# Patient Record
Sex: Female | Born: 1955 | Race: White | Hispanic: No | Marital: Married | Smoking: Never smoker
Health system: Southern US, Community
[De-identification: ages and names within clinical notes are randomized; demographics above are authoritative.]

## PROBLEM LIST (undated history)

## (undated) DIAGNOSIS — G459 Transient cerebral ischemic attack, unspecified: Secondary | ICD-10-CM

## (undated) DIAGNOSIS — D751 Secondary polycythemia: Secondary | ICD-10-CM

## (undated) DIAGNOSIS — G43909 Migraine, unspecified, not intractable, without status migrainosus: Secondary | ICD-10-CM

## (undated) DIAGNOSIS — I10 Essential (primary) hypertension: Secondary | ICD-10-CM

## (undated) HISTORY — PX: TONSILLECTOMY AND ADENOIDECTOMY: SUR1326

## (undated) HISTORY — DX: Secondary polycythemia: D75.1

## (undated) HISTORY — DX: Transient cerebral ischemic attack, unspecified: G45.9

## (undated) HISTORY — DX: Essential (primary) hypertension: I10

## (undated) HISTORY — DX: Migraine, unspecified, not intractable, without status migrainosus: G43.909

---

## 1998-04-07 ENCOUNTER — Other Ambulatory Visit: Admission: RE | Admit: 1998-04-07 | Discharge: 1998-04-07 | Payer: Self-pay | Admitting: *Deleted

## 2005-05-23 ENCOUNTER — Ambulatory Visit: Payer: Self-pay | Admitting: Family Medicine

## 2005-06-03 ENCOUNTER — Ambulatory Visit: Payer: Self-pay | Admitting: Family Medicine

## 2006-05-23 DIAGNOSIS — Z0000 Encounter for general adult medical examination without abnormal findings: Secondary | ICD-10-CM | POA: Insufficient documentation

## 2006-06-14 ENCOUNTER — Ambulatory Visit: Payer: Self-pay | Admitting: Family Medicine

## 2006-06-20 ENCOUNTER — Ambulatory Visit: Payer: Self-pay | Admitting: Gastroenterology

## 2007-05-25 DIAGNOSIS — G43109 Migraine with aura, not intractable, without status migrainosus: Secondary | ICD-10-CM | POA: Insufficient documentation

## 2007-06-19 ENCOUNTER — Ambulatory Visit: Payer: Self-pay | Admitting: Family Medicine

## 2008-05-26 DIAGNOSIS — R319 Hematuria, unspecified: Secondary | ICD-10-CM | POA: Insufficient documentation

## 2008-06-19 ENCOUNTER — Ambulatory Visit: Payer: Self-pay | Admitting: Family Medicine

## 2009-06-22 ENCOUNTER — Ambulatory Visit: Payer: Self-pay | Admitting: Family Medicine

## 2010-06-23 ENCOUNTER — Ambulatory Visit: Payer: Self-pay | Admitting: Family Medicine

## 2011-06-29 ENCOUNTER — Ambulatory Visit: Payer: Self-pay | Admitting: Family Medicine

## 2012-07-03 ENCOUNTER — Ambulatory Visit: Payer: Self-pay | Admitting: Family Medicine

## 2013-07-04 ENCOUNTER — Ambulatory Visit: Payer: Self-pay | Admitting: Family Medicine

## 2013-08-29 DIAGNOSIS — G459 Transient cerebral ischemic attack, unspecified: Secondary | ICD-10-CM

## 2013-08-29 HISTORY — DX: Transient cerebral ischemic attack, unspecified: G45.9

## 2014-04-16 ENCOUNTER — Ambulatory Visit: Payer: Self-pay

## 2014-05-27 ENCOUNTER — Ambulatory Visit: Payer: Self-pay | Admitting: Internal Medicine

## 2014-08-13 ENCOUNTER — Ambulatory Visit: Payer: Self-pay

## 2015-08-14 ENCOUNTER — Other Ambulatory Visit: Payer: Self-pay | Admitting: Nurse Practitioner

## 2015-08-14 DIAGNOSIS — Z1231 Encounter for screening mammogram for malignant neoplasm of breast: Secondary | ICD-10-CM

## 2015-08-21 ENCOUNTER — Ambulatory Visit
Admission: RE | Admit: 2015-08-21 | Discharge: 2015-08-21 | Disposition: A | Discharge status: Home / Self Care | Payer: 59 | Source: Ambulatory Visit | Attending: Nurse Practitioner | Admitting: Nurse Practitioner

## 2015-08-21 DIAGNOSIS — Z1231 Encounter for screening mammogram for malignant neoplasm of breast: Secondary | ICD-10-CM | POA: Diagnosis not present

## 2015-08-21 IMAGING — MG MM DIGITAL SCREENING BILAT W/ CAD
4 series · 4 of 4 positions shown · non-contrast
Comparison: Previous exam(s).

CLINICAL DATA: Screening.

EXAM:
DIGITAL SCREENING BILATERAL MAMMOGRAM WITH CAD

[L CC]
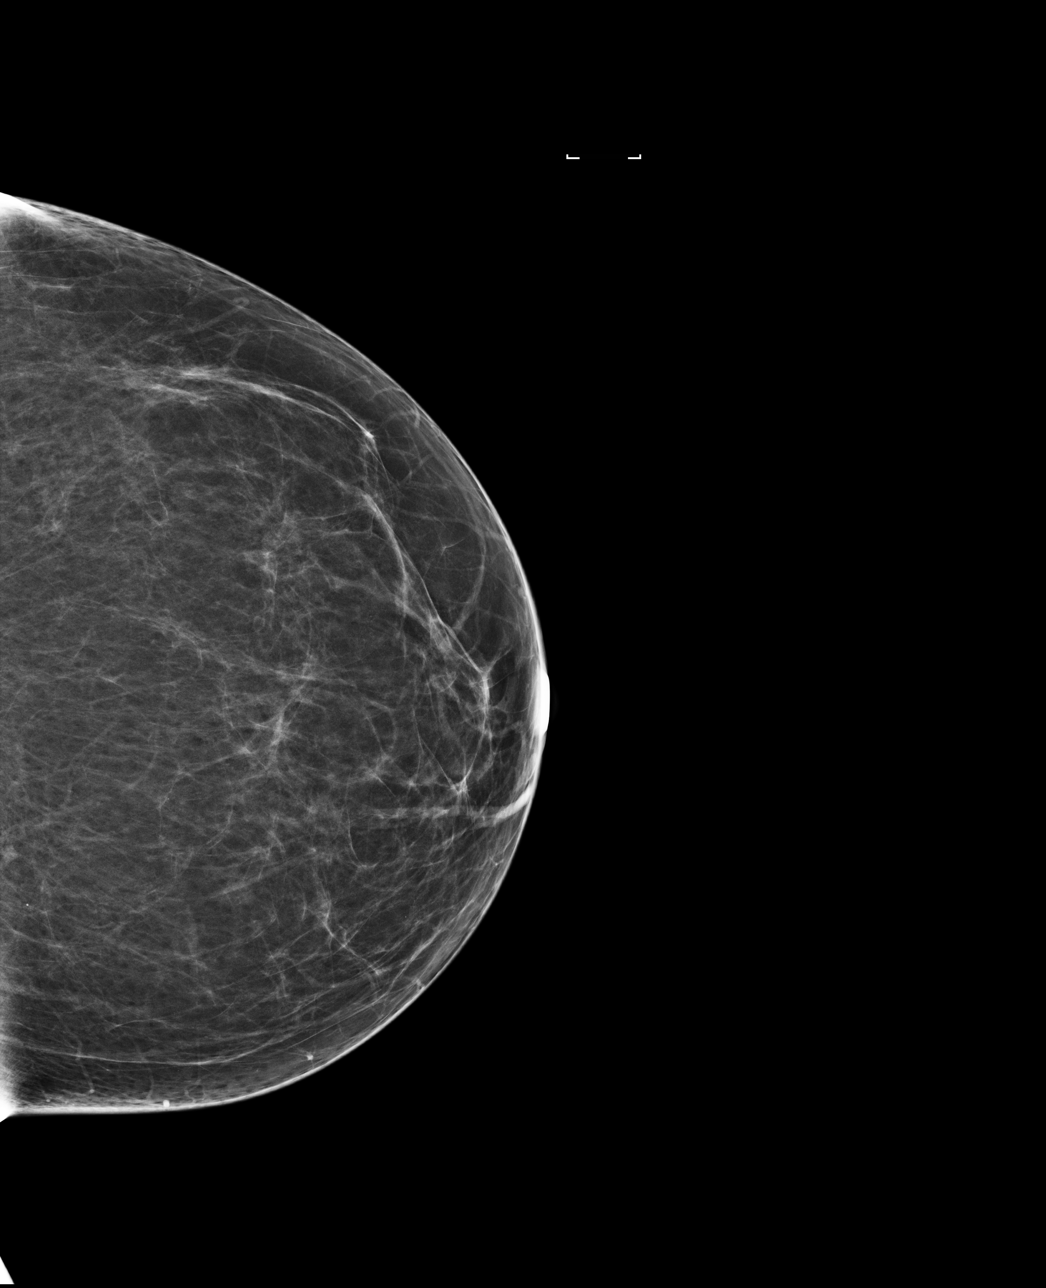

[R CC]
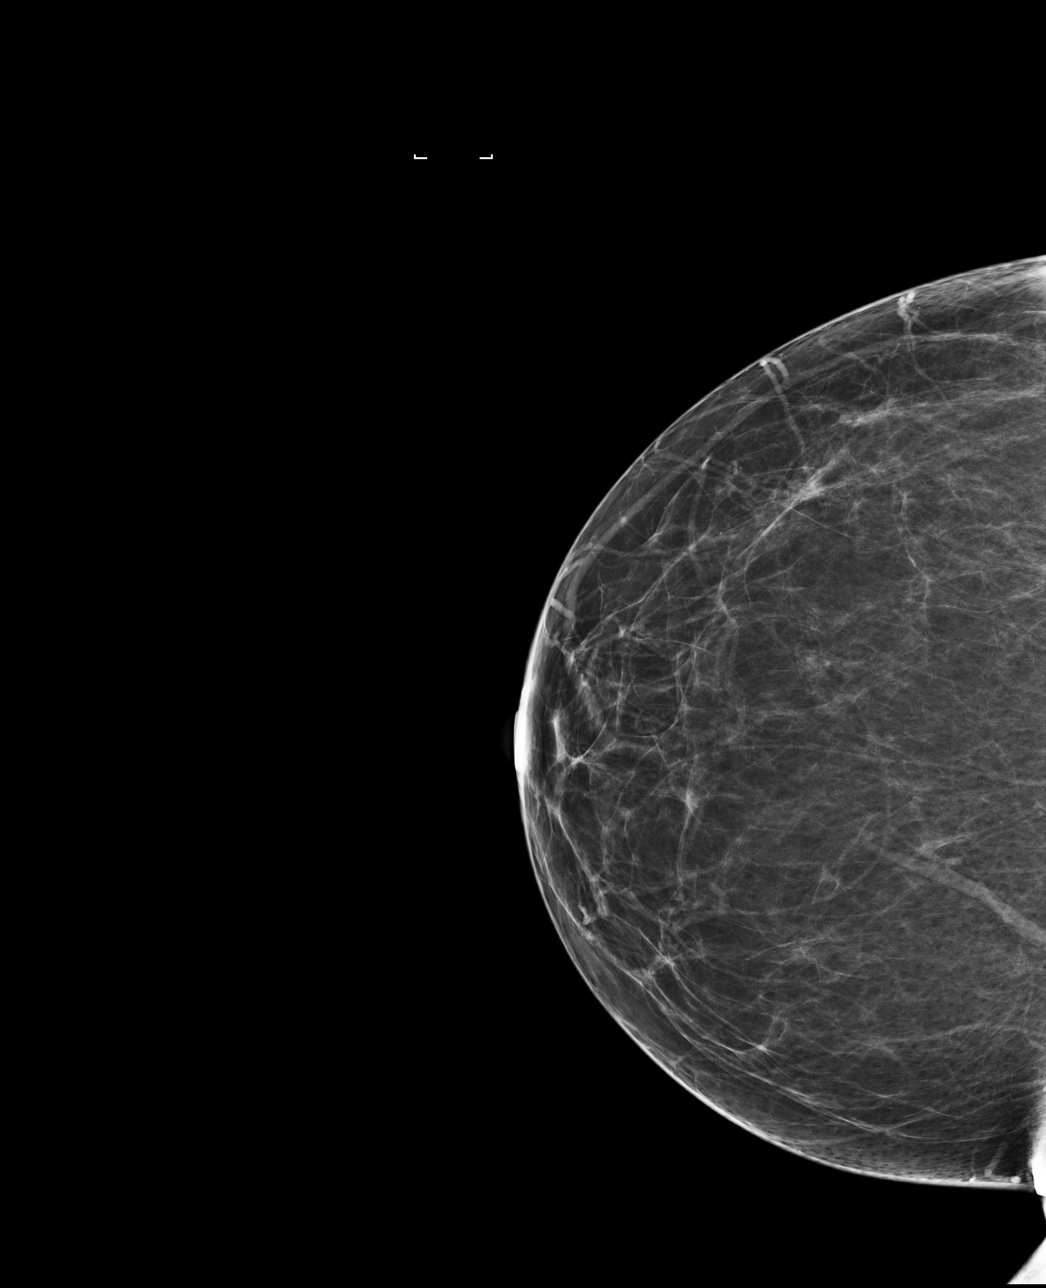

[L MLO]
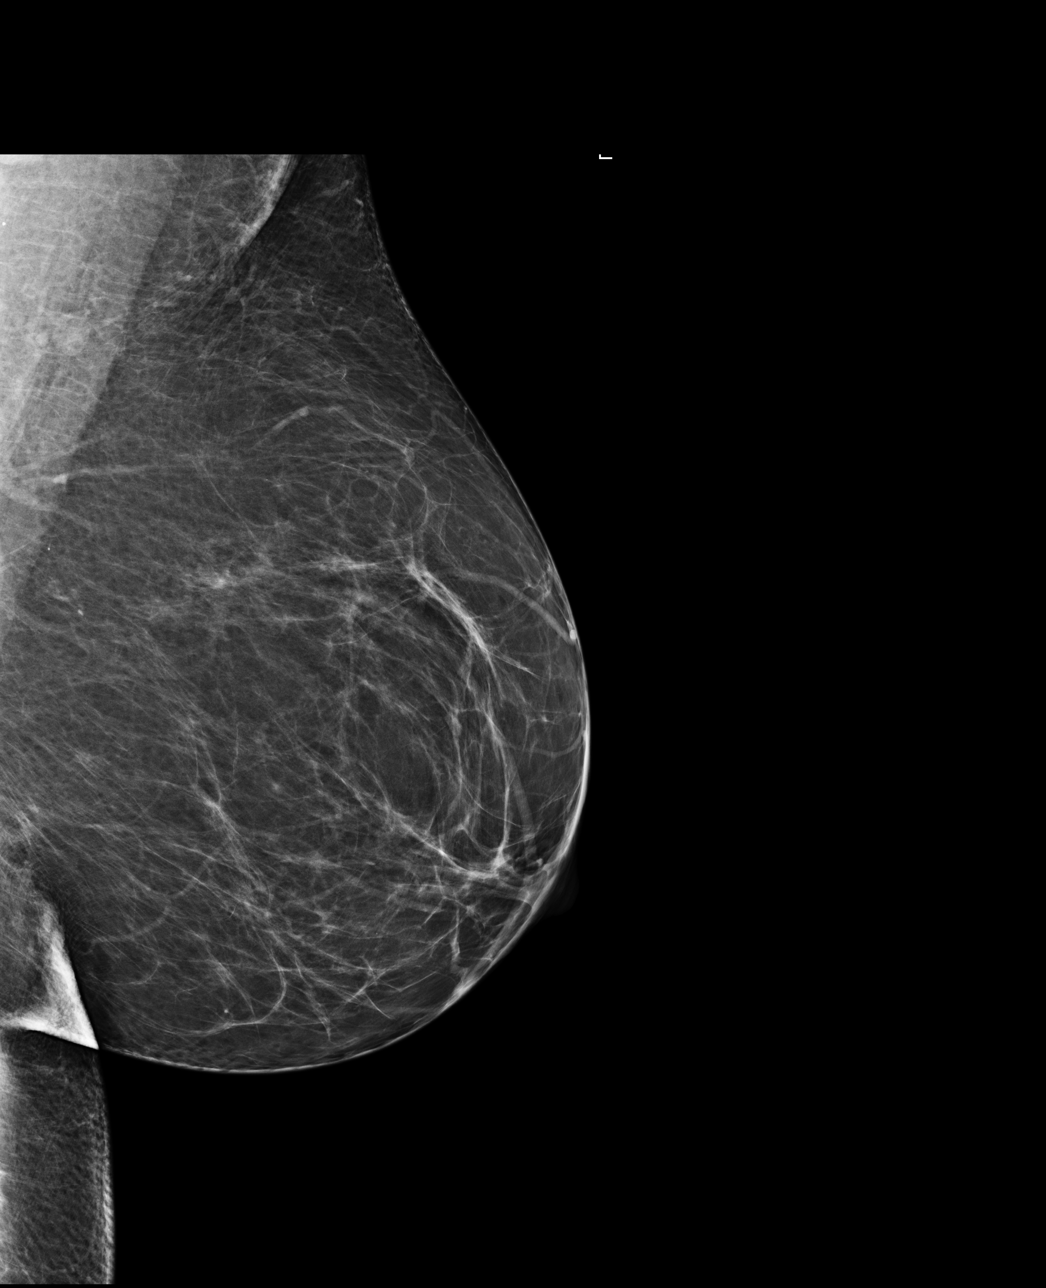

[R MLO]
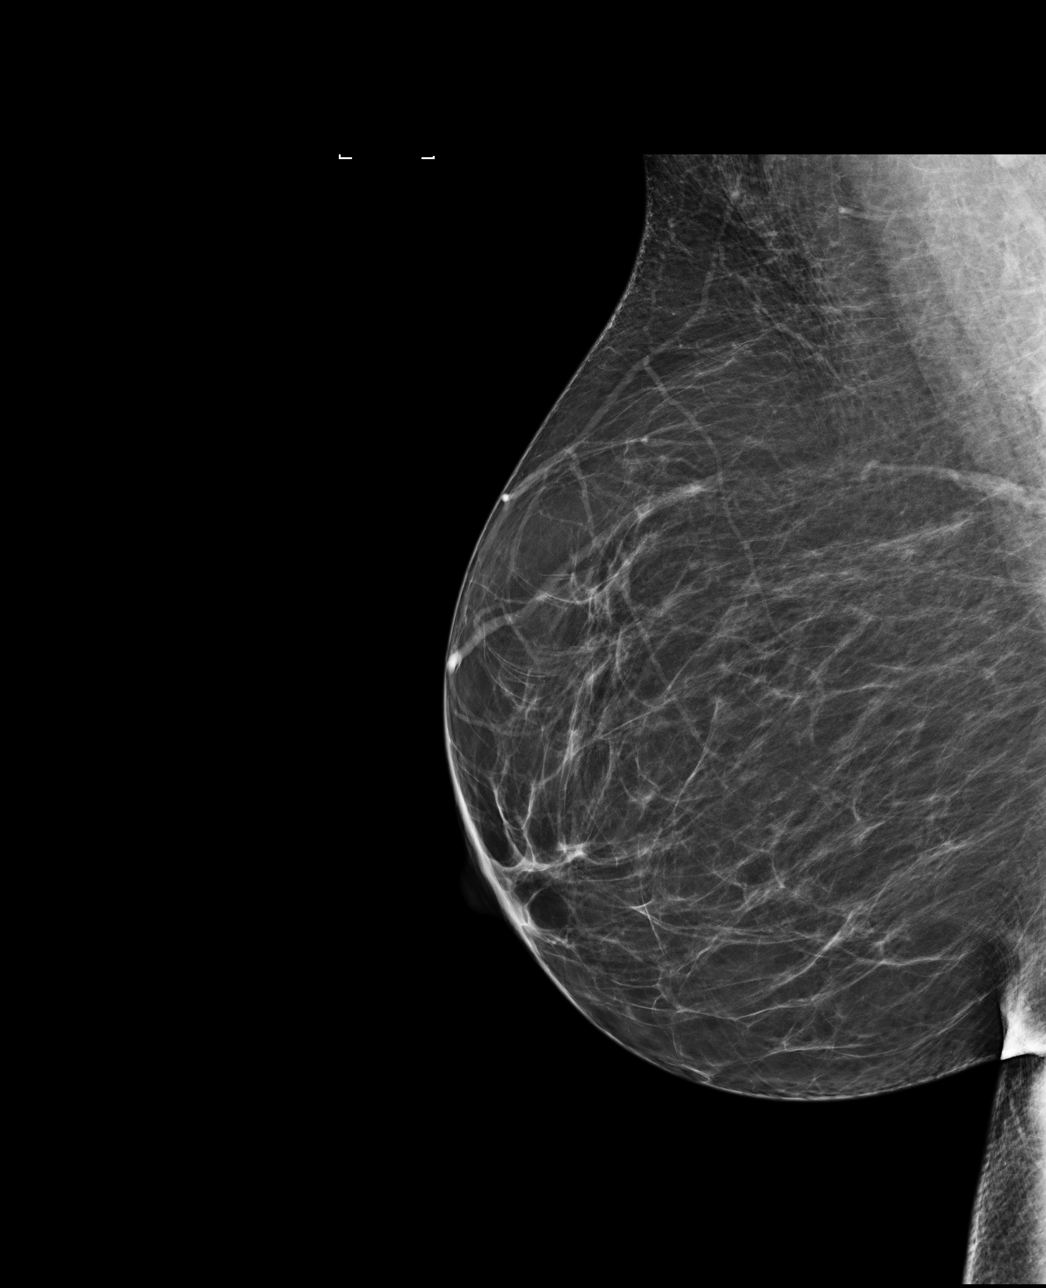

[4 of 4 positions shown; findings below may reference images not displayed]

ACR Breast Density Category b: There are scattered areas of
fibroglandular density.
FINDINGS: There are no findings suspicious for malignancy. Images were
processed with CAD.
IMPRESSION: No mammographic evidence of malignancy. A result letter of this
screening mammogram will be mailed directly to the patient.

RECOMMENDATION:
Screening mammogram in one year. (Code:[US])

BI-RADS CATEGORY  1: Negative.

## 2015-09-25 ENCOUNTER — Inpatient Hospital Stay: Payer: 59

## 2015-09-25 ENCOUNTER — Inpatient Hospital Stay: Payer: 59 | Attending: Internal Medicine | Admitting: Internal Medicine

## 2015-09-25 ENCOUNTER — Encounter: Payer: Self-pay | Admitting: Internal Medicine

## 2015-09-25 VITALS — BP 145/89 | HR 75 | Temp 98.2°F | Resp 18 | Ht 64.0 in | Wt 192.0 lb

## 2015-09-25 DIAGNOSIS — D751 Secondary polycythemia: Secondary | ICD-10-CM

## 2015-09-25 DIAGNOSIS — Z575 Occupational exposure to toxic agents in other industries: Secondary | ICD-10-CM | POA: Diagnosis not present

## 2015-09-25 DIAGNOSIS — I10 Essential (primary) hypertension: Secondary | ICD-10-CM | POA: Diagnosis not present

## 2015-09-25 DIAGNOSIS — Z7902 Long term (current) use of antithrombotics/antiplatelets: Secondary | ICD-10-CM

## 2015-09-25 DIAGNOSIS — Z79899 Other long term (current) drug therapy: Secondary | ICD-10-CM | POA: Diagnosis not present

## 2015-09-25 DIAGNOSIS — R0683 Snoring: Secondary | ICD-10-CM | POA: Diagnosis not present

## 2015-09-25 DIAGNOSIS — Z8673 Personal history of transient ischemic attack (TIA), and cerebral infarction without residual deficits: Secondary | ICD-10-CM

## 2015-09-25 LAB — CBC WITH DIFFERENTIAL/PLATELET
Basophils Absolute: 0 10*3/uL (ref 0–0.1)
Basophils Relative: 1 %
Eosinophils Absolute: 0.1 10*3/uL (ref 0–0.7)
Eosinophils Relative: 1 %
HCT: 49.6 % — ABNORMAL HIGH (ref 35.0–47.0)
Hemoglobin: 16.7 g/dL — ABNORMAL HIGH (ref 12.0–16.0)
Lymphocytes Relative: 25 %
Lymphs Abs: 1.9 10*3/uL (ref 1.0–3.6)
MCH: 29.3 pg (ref 26.0–34.0)
MCHC: 33.6 g/dL (ref 32.0–36.0)
MCV: 87 fL (ref 80.0–100.0)
Monocytes Absolute: 0.6 10*3/uL (ref 0.2–0.9)
Monocytes Relative: 8 %
Neutro Abs: 5 10*3/uL (ref 1.4–6.5)
Neutrophils Relative %: 65 %
Platelets: 234 10*3/uL (ref 150–440)
RBC: 5.71 MIL/uL — ABNORMAL HIGH (ref 3.80–5.20)
RDW: 14.1 % (ref 11.5–14.5)
WBC: 7.6 10*3/uL (ref 3.6–11.0)

## 2015-09-25 NOTE — Progress Notes (Signed)
Pt does not have migraines hardly at all anymore she states. She has no resp. Problems, non smoker and drinks a wine or wine cooler once a month.  She states migraines are from where she was on bc pills and sometimes food related.

## 2015-09-25 NOTE — Progress Notes (Signed)
Cancer Center @ [facility] Telephone:(336) [phone]  Fax:(336) [phone]     Valerie Thornton OB: 1956-06-24  MR#: [id number]  AOZ#:[id number]  No care team member to display  CHIEF COMPLAINT:  Chief Complaint  Patient presents with  . erythrocytosis     No history exists.    No flowsheet data found.  HISTORY OF PRESENT ILLNESS:   Valerie Thornton is a 60 year old Caucasian female, who is referred to our clinic for evaluation of erythrocytosis. She is not aware of her baseline hemoglobin, but she developed TIA in the fall of 2015, while taking oral contraceptive pills. At that time she was found to have mildly elevated hemoglobin of 16.2 g/dL. She discontinued OCPs at that point, and her hemoglobin was followed with repeated measurements, which showed further increase to 16.5 g/dL in the fall of 9629. 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 She denies any aquagenic pruritus, redness and swelling of palms and soles, chest pain, shortness of breath, nausea, vomiting, diarrhea, weight loss, cough. She does not leave in or travel to areas of high elevation. She had colonoscopy at the age of 62 (reportedly normal), and mammogram in December 2016 (reportedly normal)  REVIEW OF SYSTEMS:   Review of Systems  All other systems reviewed and are negative.    PAST MEDICAL HISTORY: Past Medical History  Diagnosis Date  . Erythrocytosis   . TIA (transient ischemic attack) 2015    affected right side but completely resolved  . Hypertension   . Migraines     per pt it was hormonal and food caused, hardly anymore she has one    PAST SURGICAL HISTORY: Past Surgical History  Procedure Laterality Date  . Tonsillectomy and adenoidectomy      pt was 60 years old    FAMILY HISTORY Family History  Problem Relation Age of Onset  . Breast cancer Maternal Aunt 41    ADVANCED DIRECTIVES:  No flowsheet data  found.  HEALTH MAINTENANCE: Social History  Substance Use Topics  . Smoking status: Never Smoker   . Smokeless tobacco: Never Used  . Alcohol Use: Yes     Comment: glass once a month     Allergies  Allergen Reactions  . Sulfa Antibiotics Hives    Current Outpatient Prescriptions  Medication Sig Dispense Refill  . amLODipine (NORVASC) 5 MG tablet Take 5 mg by mouth daily.    . benazepril (LOTENSIN) 5 MG tablet Take 5 mg by mouth daily.    . clopidogrel (PLAVIX) 75 MG tablet Take 75 mg by mouth daily.    Marland Kitchen lovastatin (MEVACOR) 40 MG tablet Take 40 mg by mouth at bedtime.    . lovastatin (MEVACOR) 40 MG tablet Take by mouth.     No current facility-administered medications for this visit.    OBJECTIVE:  Filed Vitals:   09/25/15 0952  BP: 145/89  Pulse: 75  Temp: 98.2 F (36.8 C)  Resp: 18     Body mass index is 32.94 kg/(m^2).    ECOG FS:0 - Asymptomatic  Physical Exam  Constitutional: She is oriented to person, place, and time and well-developed, well-nourished, and in no distress. No distress.  Younger than stated age Caucasian female  HENT:  Head: Normocephalic and atraumatic.  Right Ear: External ear normal.  Left Ear: External ear normal.  Mouth/Throat: Oropharynx is clear and moist.  Eyes: Conjunctivae are normal. Pupils are equal, round, and  reactive to light. Right eye exhibits no discharge. Left eye exhibits no discharge. No scleral icterus.  Neck: Normal range of motion. Neck supple. No JVD present. No tracheal deviation present. No thyromegaly present.  Cardiovascular: Normal rate, regular rhythm, normal heart sounds and intact distal pulses.  Exam reveals no gallop and no friction rub.   No murmur heard. Pulmonary/Chest: Effort normal and breath sounds normal. No stridor. No respiratory distress. She has no wheezes. She has no rales. She exhibits no tenderness.  Abdominal: Soft. Bowel sounds are normal. She exhibits no distension and no mass. There is no  tenderness. There is no rebound and no guarding.  Genitourinary:  Postponed  Musculoskeletal: Normal range of motion. She exhibits no edema or tenderness.  Lymphadenopathy:    She has no cervical adenopathy.  Neurological: She is alert and oriented to person, place, and time. She has normal reflexes. No cranial nerve deficit. She exhibits normal muscle tone. Gait normal. Coordination normal. GCS score is 15.  Mild facial asymmetry, but no focal abnormality  Skin: Skin is warm. No rash noted. She is not diaphoretic. No erythema. No pallor.  Psychiatric: Mood, memory, affect and judgment normal.  Nursing note and vitals reviewed.    LAB RESULTS:  No flowsheet data found.  Appointment on 09/25/2015  Component Date Value Ref Range Status  . WBC 09/25/2015 7.6  3.6 - 11.0 K/uL Final  . RBC 09/25/2015 5.71* 3.80 - 5.20 MIL/uL Final  . Hemoglobin 09/25/2015 16.7* 12.0 - 16.0 g/dL Final  . HCT 54/04/8118 49.6* 35.0 - 47.0 % Final  . MCV 09/25/2015 87.0  80.0 - 100.0 fL Final  . MCH 09/25/2015 29.3  26.0 - 34.0 pg Final  . MCHC 09/25/2015 33.6  32.0 - 36.0 g/dL Final  . RDW 14/78/2956 14.1  11.5 - 14.5 % Final  . Platelets 09/25/2015 234  150 - 440 K/uL Final  . Neutrophils Relative % 09/25/2015 65   Final  . Neutro Abs 09/25/2015 5.0  1.4 - 6.5 K/uL Final  . Lymphocytes Relative 09/25/2015 25   Final  . Lymphs Abs 09/25/2015 1.9  1.0 - 3.6 K/uL Final  . Monocytes Relative 09/25/2015 8   Final  . Monocytes Absolute 09/25/2015 0.6  0.2 - 0.9 K/uL Final  . Eosinophils Relative 09/25/2015 1   Final  . Eosinophils Absolute 09/25/2015 0.1  0 - 0.7 K/uL Final  . Basophils Relative 09/25/2015 1   Final  . Basophils Absolute 09/25/2015 0.0  0 - 0.1 K/uL Final      STUDIES: No results found.  ASSESSMENT and MEDICAL DECISION MAKING:  Erythrocytosis-discussed the approach to erythrocytosis, specifically the need to rule out primary erythrocytosis (polycythemia vera) by performing [id number]  and erythropoietin testing. I also explained to the patient that the majority of cases of erythrocytosis are secondary to underlying health problems, such as COPD, emphysema, obstructive sleep apnea, or lifestyle/occupational exposures, which include exposure to cigarette smoking, exhaust fumes or living at high altitude. On rare occasions erythrocytosis could be a sign of erythropoietin-producing tumors. 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 We agreed to proceed with polycythemia vera workup, and if negative, she will consider removing herself from performing this type of family activities for the next 2-3 months. She claims that she snores, but not heavily, so we Yurick consider referring her for sleep study, but after the workup is completed.   Mrs.  Thornton will return to our clinic in 2 weeks to discuss the results of the workup. Patient expressed understanding and was in agreement with this plan. She also understands that She can call clinic any time with any questions, concerns, or complaints.    No matching staging information was found for the patient.  Gorden Harms, MD   09/25/2015 9:51 AM

## 2015-10-05 LAB — JAK2  V617F QUAL. WITH REFLEX TO EXON 12

## 2015-10-05 LAB — JAK2 EXON 12 MUTATION ANALYSIS

## 2015-10-05 LAB — ERYTHROPOIETIN: Erythropoietin: 14.1 m[IU]/mL (ref 2.6–18.5)

## 2015-10-09 ENCOUNTER — Inpatient Hospital Stay: Payer: 59 | Attending: Internal Medicine | Admitting: Internal Medicine

## 2015-10-09 DIAGNOSIS — I10 Essential (primary) hypertension: Secondary | ICD-10-CM | POA: Diagnosis not present

## 2015-10-09 DIAGNOSIS — Z8673 Personal history of transient ischemic attack (TIA), and cerebral infarction without residual deficits: Secondary | ICD-10-CM | POA: Diagnosis not present

## 2015-10-09 DIAGNOSIS — Z803 Family history of malignant neoplasm of breast: Secondary | ICD-10-CM | POA: Diagnosis not present

## 2015-10-09 DIAGNOSIS — Z77110 Contact with and (suspected) exposure to air pollution: Secondary | ICD-10-CM | POA: Diagnosis not present

## 2015-10-09 DIAGNOSIS — D751 Secondary polycythemia: Secondary | ICD-10-CM | POA: Diagnosis not present

## 2015-10-09 DIAGNOSIS — Z79899 Other long term (current) drug therapy: Secondary | ICD-10-CM | POA: Diagnosis not present

## 2015-10-09 NOTE — Progress Notes (Signed)
Pt here for lab work up results from her initial visit.

## 2015-10-10 NOTE — Progress Notes (Signed)
Cancer Center @ [facility] Telephone:([phone]  Fax:(336) [phone]     Braylie Badami Rausch OB: Aug 31, 1955  MR#: [id number]  AOZ#:[id number]  Patient Care Team: Carlean Jews, NP as PCP - General (Family Medicine)  CHIEF COMPLAINT:  Chief Complaint  Patient presents with  . erythrocytosis     No history exists.    No flowsheet data found.  HISTORY OF PRESENT ILLNESS:   Valerie Thornton is a 60 year old Caucasian female, who is referred to our clinic for evaluation of erythrocytosis. She is not aware of her baseline hemoglobin, but she developed TIA in the fall of 2015, while taking oral contraceptive pills. At that time she was found to have mildly elevated hemoglobin of 16.2 g/dL. She discontinued OCPs at that point, and her hemoglobin was followed with repeated measurements, which showed further increase to 16.5 g/dL in the fall of 9629. 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 She denies any aquagenic pruritus, redness and swelling of palms and soles, chest pain, shortness of breath, nausea, vomiting, diarrhea, weight loss, cough. She does not leave in or travel to areas of high elevation. She had colonoscopy at the age of 6 (reportedly normal), and mammogram in December 2016 (reportedly normal)  Current status: Valerie Thornton returns to our clinic for a follow-up visit to discuss the results of the workup. She has done very well clinically since her initial presentation. She does not have any complaints at this point. Specifically, she denies shortness of breath, wheezing, chest pain, headaches, nausea, vomiting, diarrhea, bleeding from any source. REVIEW OF SYSTEMS:   Review of Systems  All other systems reviewed and are negative.    PAST MEDICAL HISTORY: Past Medical History  Diagnosis Date  . Erythrocytosis   . TIA (transient ischemic attack) 2015    affected right side but completely resolved  .  Hypertension   . Migraines     per pt it was hormonal and food caused, hardly anymore she has one    PAST SURGICAL HISTORY: Past Surgical History  Procedure Laterality Date  . Tonsillectomy and adenoidectomy      pt was 60 years old    FAMILY HISTORY Family History  Problem Relation Age of Onset  . Breast cancer Maternal Aunt 70  . Hypertension Mother   . Hypertension Sister     ADVANCED DIRECTIVES:  No flowsheet data found.  HEALTH MAINTENANCE: Social History  Substance Use Topics  . Smoking status: Never Smoker   . Smokeless tobacco: Never Used  . Alcohol Use: Yes     Comment: glass once a month     Allergies  Allergen Reactions  . Sulfa Antibiotics Hives    Current Outpatient Prescriptions  Medication Sig Dispense Refill  . amLODipine (NORVASC) 5 MG tablet Take 5 mg by mouth daily.    . benazepril (LOTENSIN) 5 MG tablet Take 5 mg by mouth daily.    . clopidogrel (PLAVIX) 75 MG tablet Take 75 mg by mouth daily.    Marland Kitchen lovastatin (MEVACOR) 40 MG tablet Take 40 mg by mouth at bedtime.     No current facility-administered medications for this visit.    OBJECTIVE:  There were no vitals filed for this visit.   There is no weight on file to calculate BMI.    ECOG FS:0 - Asymptomatic  Physical Exam  Constitutional: She is oriented to person, place, and time and well-developed, well-nourished, and  in no distress. No distress.  Younger than stated age Caucasian female  HENT:  Head: Normocephalic and atraumatic.  Right Ear: External ear normal.  Left Ear: External ear normal.  Mouth/Throat: Oropharynx is clear and moist.  Eyes: Conjunctivae are normal. Pupils are equal, round, and reactive to light. Right eye exhibits no discharge. Left eye exhibits no discharge. No scleral icterus.  Neck: Normal range of motion. Neck supple. No JVD present. No tracheal deviation present. No thyromegaly present.  Cardiovascular: Normal rate, regular rhythm, normal heart sounds and  intact distal pulses.  Exam reveals no gallop and no friction rub.   No murmur heard. Pulmonary/Chest: Effort normal and breath sounds normal. No stridor. No respiratory distress. She has no wheezes. She has no rales. She exhibits no tenderness.  Abdominal: Soft. Bowel sounds are normal. She exhibits no distension and no mass. There is no tenderness. There is no rebound and no guarding.  Genitourinary:  Postponed  Musculoskeletal: Normal range of motion. She exhibits no edema or tenderness.  Lymphadenopathy:    She has no cervical adenopathy.  Neurological: She is alert and oriented to person, place, and time. She has normal reflexes. No cranial nerve deficit. She exhibits normal muscle tone. Gait normal. Coordination normal. GCS score is 15.  Mild facial asymmetry, but no focal abnormality  Skin: Skin is warm. No rash noted. She is not diaphoretic. No erythema. No pallor.  Psychiatric: Mood, memory, affect and judgment normal.  Nursing note and vitals reviewed.    LAB RESULTS:  CBC Latest Ref Rng 09/25/2015  WBC 3.6 - 11.0 K/uL 7.6  Hemoglobin 12.0 - 16.0 g/dL 16.7(H)  Hematocrit 35.0 - 47.0 % 49.6(H)  Platelets 150 - 440 K/uL 234    No visits with results within 5 Day(s) from this visit. Latest known visit with results is:  Clinical Support on 09/25/2015  Component Date Value Ref Range Status  . WBC 09/25/2015 7.6  3.6 - 11.0 K/uL Final  . RBC 09/25/2015 5.71* 3.80 - 5.20 MIL/uL Final  . Hemoglobin 09/25/2015 16.7* 12.0 - 16.0 g/dL Final  . HCT 21/30/8657 49.6* 35.0 - 47.0 % Final  . MCV 09/25/2015 87.0  80.0 - 100.0 fL Final  . MCH 09/25/2015 29.3  26.0 - 34.0 pg Final  . MCHC 09/25/2015 33.6  32.0 - 36.0 g/dL Final  . RDW 84/69/6295 14.1  11.5 - 14.5 % Final  . Platelets 09/25/2015 234  150 - 440 K/uL Final  . Neutrophils Relative % 09/25/2015 65   Final  . Neutro Abs 09/25/2015 5.0  1.4 - 6.5 K/uL Final  . Lymphocytes Relative 09/25/2015 25   Final  . Lymphs Abs  09/25/2015 1.9  1.0 - 3.6 K/uL Final  . Monocytes Relative 09/25/2015 8   Final  . Monocytes Absolute 09/25/2015 0.6  0.2 - 0.9 K/uL Final  . Eosinophils Relative 09/25/2015 1   Final  . Eosinophils Absolute 09/25/2015 0.1  0 - 0.7 K/uL Final  . Basophils Relative 09/25/2015 1   Final  . Basophils Absolute 09/25/2015 0.0  0 - 0.1 K/uL Final  . JAK2 GenotypR 09/25/2015 Comment   Final   Comment: (NOTE) Result: NEGATIVE for the JAK2 V617F mutation. Interpretation:  The G to T nucleotide change encoding the V617F mutation was not detected.  This result does not rule out the presence of the JAK2 mutation at a level below the sensitivity of detection of this assay, or the presence of other mutations within JAK2 not detected by this  assay.  This result does not rule out a diagnosis of polycythemia vera, essential thrombocythemia or idiopathic myelofibrosis as the V617F mutation is not detected in all patients with these disorders.   Marland Kitchen BACKGROUND: 09/25/2015 Comment   Final   Comment: (NOTE) JAK2 is a cytoplasmic tyrosine kinase with a key role in signal transduction from multiple hematopoietic growth factor receptors. A point mutation within exon 14 of the JAK2 gene ([id number]) encoding a valine to phenylalanine substitution at position 617 of the JAK2 protein (V617F) has been identified in most patients with polycythemia vera, and in about half of those with either essential thrombocythemia or idiopathic myelofibrosis.  The V617F has also been detected, although infrequently, in other myeloid disorders such as chronic myelomonocytic leukemia and chronic neutrophilic leukemia. V617F is an acquired mutation that alters a highly conserved valine present in the negative regulatory JH2 domain of the JAK2 protein and is predicted to dysregulate kinase activity. Methodology: Genomic DNA was purified from the provided specimen.  Allele- specific PCR using fluorescent primers was used to  simultaneously amplify both the wild type and mutant alleles. Amplificati                          on products were analyzed by capillary electrophoresis.  This assay has a sensitivity to detect approximately a 5% population of cells containing the V617F mutation in a background of non-mutant cells. Reference: Tefferi A and Gilliland DG.  The JAK2 V617F Tyrosine Kinase Mutation in Myeloproliferative  Disorders:  Status Report and Immediate Implications for Disease Classification and Diagnosis. Mayo Clin Proc 2005;80(7):947-958.   . Director Review, JAK2 09/25/2015 Comment   Final   Comment: (NOTE) Erskine Speed MS, PhD, [facility] Technical Director [facility] for [facility] Pathology Research [place], [place] [phone]   . Reflex: 09/25/2015 Comment   Corrected   Comment: (NOTE) Reflex to JAK2 Exon 12 Mutation Analysis is indicated. Performed At: [facility] [address] [place], [place] [id number] Oley Balm MD NG:[id number] Performed At: [facility] [address] [place], [place] [id number] Oley Balm MD [id number]   . Erythropoietin 09/25/2015 14.1  2.6 - 18.5 mIU/mL Final   Comment: (NOTE) Performed At: [facility] [address] [place], [place] [id number] Mila Homer MD PI:[id number]   . JAK2 EXON 12 ANALYSIS RESULT: 09/25/2015 Comment   Final   Comment: (NOTE) NEGATIVE for the JAK2 exon 12 mutations. Interpretation: JAK2 exon 12 mutations were not detected. This result does not rule out the presence of the JAK2 mutation at a level below the sensitivity of detection of this assay, or the presence of other mutations within JAK2 not detected by this assay. This result does not rule out a diagnosis of polycythemia vera, essential thrombocythemia or idiopathic myelofibrosis as JAK2 exon 12 mutations are not detected in all patients with these disorders.   Marland Kitchen BACKGROUND: 09/25/2015 Comment   Final     Comment: (NOTE) JAK2 is a cytoplasmic tyrosine kinase with a key role in signal transduction in conjunction with multiple hematopoietic growth factor receptors. JAK2 mutation analysis can be used in conjunction with bone marrow histology and cytogenetic analysis to assist in the diagnosis of myeloproliferative disorders (MPDs) such as polycythemia vera, essential thrombocythemia, and idiopathic myelofibrosis. The JAK2 V617F (exon 14) mutation analysis can provide information helpful in distinguishing MPDs from from reactive conditions, particularly secondary thrombocytosis and erythrocytosis. Because JAK2 exon 12 mutation status is associated with  erythrocytosis and atypical bone marrow morphology, mutational analysis Staiger be used to differentiate the reactive conditions from the malignant erythrocytosis. Peripheral blood mutation screening cannot substitute for bone marrow histology as JAK2 mutations are not always detected in patients with polycythemia vera and is absent in a                          lmost half of patients with essential thrombocythemia and idiopathic myelofibrosis. JAK2 Exon 12 mutations detected by this assay: [id number], [id number], K539L, N542-[id number]. Methodology: Genomic DNA was purified from blood or bone marrow specimen. Allele-specific PCR using fluorescent primers was used to amplify both the wild type and mutant alleles. Amplification products were analyzed by capillary electrophoresis. Reference: 1. Tefferi A. JAK2 Mutations in Polycythemia Vera-Molecular   Mechanisms and Clinical Applications. N Engl J Med. 2007;   356(5):443-444. 2. Scott LM. et al. JAK2 Exon 12 Mutations in Polycythemia   Vera and Idiopathic Erythrocytosis. N Engl J Med. 2007;   356(5):459-468.   Marland Kitchen DIRECTOR REVIEW: 09/25/2015 Comment   Final   Comment: (NOTE) Erskine Speed MS, PhD, [facility] Technical Director [facility] for Molecular Biology and Pathology Research  [place], [place] [phone] Performed At: [facility] [address] [place], [place] [id number] Oley Balm MD XL:[id number] Performed At: [facility] [address] [place], [place] [id number] Oley Balm MD QQ:[id number]       STUDIES: No results found.  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 There is no concern about erythropoietin-producing solid tumors, since erythropoietin level is normal. There is no clear source of erythrocytosis identified at this point, but in the absence of clinical signs or symptoms, there is no workup which should be pursued at this time. There is no need for phlebotomy, since the patient does not have a primary hematologic malignancy, such as polycythemia vera, and the patient is essentially asymptomatic. There is no need for additional hematologic evaluation, but she should continue to have a regular follow-up with her primary care physician, and if additional problems emerge over the next years, she can be referred to [place].   Patient expressed understanding and was in agreement with this plan. She also understands that She can call clinic any time with any questions, concerns, or complaints.    No matching staging information was found for the patient.  Gorden Harms, MD   10/10/2015 9:56 AM

## 2016-03-24 ENCOUNTER — Other Ambulatory Visit: Payer: Self-pay | Admitting: Internal Medicine

## 2016-03-24 ENCOUNTER — Ambulatory Visit
Admission: RE | Admit: 2016-03-24 | Discharge: 2016-03-24 | Disposition: A | Discharge status: Home / Self Care | Payer: 59 | Source: Ambulatory Visit | Attending: Internal Medicine | Admitting: Internal Medicine

## 2016-03-24 DIAGNOSIS — M79604 Pain in right leg: Secondary | ICD-10-CM | POA: Insufficient documentation

## 2016-03-24 DIAGNOSIS — M79605 Pain in left leg: Principal | ICD-10-CM

## 2016-09-21 ENCOUNTER — Other Ambulatory Visit: Payer: Self-pay | Admitting: Nurse Practitioner

## 2016-09-21 DIAGNOSIS — Z1231 Encounter for screening mammogram for malignant neoplasm of breast: Secondary | ICD-10-CM

## 2016-10-25 ENCOUNTER — Ambulatory Visit
Admission: RE | Admit: 2016-10-25 | Discharge: 2016-10-25 | Disposition: A | Discharge status: Home / Self Care | Payer: BLUE CROSS/BLUE SHIELD | Source: Ambulatory Visit | Attending: Nurse Practitioner | Admitting: Nurse Practitioner

## 2016-10-25 DIAGNOSIS — Z1231 Encounter for screening mammogram for malignant neoplasm of breast: Secondary | ICD-10-CM | POA: Diagnosis not present

## 2017-10-04 DIAGNOSIS — M1710 Unilateral primary osteoarthritis, unspecified knee: Secondary | ICD-10-CM | POA: Insufficient documentation

## 2017-10-04 DIAGNOSIS — M179 Osteoarthritis of knee, unspecified: Secondary | ICD-10-CM | POA: Insufficient documentation

## 2017-10-06 ENCOUNTER — Other Ambulatory Visit: Payer: Self-pay

## 2017-10-06 MED ORDER — CLOPIDOGREL BISULFATE 75 MG PO TABS: 75.0000 mg | ORAL_TABLET | Freq: Every day | ORAL | 1 refills | Status: DC

## 2017-10-06 MED ORDER — LOVASTATIN 40 MG PO TABS: 40.0000 mg | ORAL_TABLET | Freq: Every day | ORAL | 1 refills | Status: DC

## 2017-10-10 ENCOUNTER — Other Ambulatory Visit: Payer: Self-pay

## 2017-10-10 MED ORDER — BENAZEPRIL HCL 5 MG PO TABS: 5.0000 mg | ORAL_TABLET | Freq: Every day | ORAL | 5 refills | Status: DC

## 2017-10-10 MED ORDER — AMLODIPINE BESYLATE 5 MG PO TABS: 5.0000 mg | ORAL_TABLET | Freq: Every day | ORAL | 5 refills | Status: DC

## 2017-10-25 ENCOUNTER — Other Ambulatory Visit: Payer: Self-pay | Admitting: Nurse Practitioner

## 2017-10-25 DIAGNOSIS — Z1231 Encounter for screening mammogram for malignant neoplasm of breast: Secondary | ICD-10-CM

## 2017-11-13 ENCOUNTER — Ambulatory Visit
Admission: RE | Admit: 2017-11-13 | Discharge: 2017-11-13 | Disposition: A | Discharge status: Home / Self Care | Payer: BLUE CROSS/BLUE SHIELD | Source: Ambulatory Visit | Attending: Nurse Practitioner | Admitting: Nurse Practitioner

## 2017-11-13 DIAGNOSIS — Z1231 Encounter for screening mammogram for malignant neoplasm of breast: Secondary | ICD-10-CM | POA: Diagnosis not present

## 2017-12-25 ENCOUNTER — Ambulatory Visit (INDEPENDENT_AMBULATORY_CARE_PROVIDER_SITE_OTHER): Payer: BLUE CROSS/BLUE SHIELD | Admitting: Nurse Practitioner

## 2017-12-25 ENCOUNTER — Encounter: Payer: Self-pay | Admitting: Nurse Practitioner

## 2017-12-25 VITALS — BP 129/79 | HR 76 | Resp 16 | Ht 64.0 in | Wt 191.4 lb

## 2017-12-25 DIAGNOSIS — E559 Vitamin D deficiency, unspecified: Secondary | ICD-10-CM | POA: Diagnosis not present

## 2017-12-25 DIAGNOSIS — R1011 Right upper quadrant pain: Secondary | ICD-10-CM

## 2017-12-25 DIAGNOSIS — E669 Obesity, unspecified: Secondary | ICD-10-CM | POA: Diagnosis not present

## 2017-12-25 DIAGNOSIS — Z124 Encounter for screening for malignant neoplasm of cervix: Secondary | ICD-10-CM

## 2017-12-25 DIAGNOSIS — I10 Essential (primary) hypertension: Secondary | ICD-10-CM | POA: Diagnosis not present

## 2017-12-25 DIAGNOSIS — Z0001 Encounter for general adult medical examination with abnormal findings: Secondary | ICD-10-CM | POA: Diagnosis not present

## 2017-12-25 DIAGNOSIS — E782 Mixed hyperlipidemia: Secondary | ICD-10-CM | POA: Diagnosis not present

## 2017-12-25 DIAGNOSIS — R300 Dysuria: Secondary | ICD-10-CM

## 2017-12-25 LAB — HM PAP SMEAR: HM Pap smear: NEGATIVE

## 2017-12-25 NOTE — Progress Notes (Signed)
[facility] [address] [place], [place] [address]  Internal MEDICINE  Office Visit Note  Patient Name: Valerie Thornton  [id number]  [id number]  Date of Service: 01/14/2018   Pt is here for routine health maintenance examination   Chief Complaint  Patient presents with  . Annual Exam    21month physical with pap  . Hypertension  . Abdominal Pain    increased gas, bloating about 2 hours after meals.      Hypertension  This is a chronic problem. The current episode started more than 1 year ago. The problem is unchanged. The problem is controlled. Associated symptoms include malaise/fatigue. Pertinent negatives include no chest pain, headaches, neck pain, palpitations or shortness of breath. There are no associated agents to hypertension. Risk factors for coronary artery disease include dyslipidemia and post-menopausal state. Past treatments include calcium channel blockers and ACE inhibitors. The current treatment provides moderate improvement. There are no compliance problems.  Hypertensive end-organ damage includes CVA.  Abdominal Pain  This is a recurrent problem. The current episode started 1 to 4 weeks ago. The onset quality is gradual. The problem occurs intermittently. The problem has been unchanged. The pain is located in the RUQ. The pain is at a severity of 3/10. The pain is moderate. The quality of the pain is colicky. The abdominal pain radiates to the epigastric region, back and right shoulder. Associated symptoms include anorexia, belching and nausea. Pertinent negatives include no arthralgias, constipation, diarrhea, dysuria, frequency, headaches or vomiting. The pain is aggravated by eating and palpation. The pain is relieved by belching and passing flatus. She has tried antacids and H2 blockers for the symptoms. The treatment provided mild relief.    Current Medication: Outpatient Encounter Medications as of 12/25/2017  Medication Sig  . amLODipine (NORVASC) 5  MG tablet Take 1 tablet (5 mg total) by mouth daily.  . benazepril (LOTENSIN) 5 MG tablet Take 1 tablet (5 mg total) by mouth daily.  . clopidogrel (PLAVIX) 75 MG tablet Take 1 tablet (75 mg total) by mouth daily.  Marland Kitchen lovastatin (MEVACOR) 40 MG tablet Take 1 tablet (40 mg total) by mouth at bedtime.  . [DISCONTINUED] terbinafine (LAMISIL) 250 MG tablet terbinafine HCl 250 mg tablet   No facility-administered encounter medications on file as of 12/25/2017.     Surgical History: Past Surgical History:  Procedure Laterality Date  . TONSILLECTOMY AND ADENOIDECTOMY     pt was 62 years old    Medical History: Past Medical History:  Diagnosis Date  . Erythrocytosis   . Hypertension   . 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 TIA (transient ischemic attack) 2015   affected right side but completely resolved    Family History: Family History  Problem Relation Age of Onset  . Breast cancer Maternal Aunt 70  . Hypertension Mother   . Hypertension Sister       Review of Systems  Constitutional: Positive for appetite change and malaise/fatigue. Negative for activity change, chills, fatigue and unexpected weight change.  HENT: Negative for congestion, postnasal drip, rhinorrhea, sneezing and sore throat.   Eyes: Negative.  Negative for redness.  Respiratory: Negative for cough, chest tightness, shortness of breath and wheezing.   Cardiovascular: Negative for chest pain and palpitations.  Gastrointestinal: Positive for abdominal distention, abdominal pain, anorexia and nausea. Negative for constipation, diarrhea and vomiting.       Bloating and excess gas.  Endocrine: Negative for cold intolerance, heat intolerance, polydipsia, polyphagia and polyuria.  Genitourinary: Negative for dysuria, frequency, pelvic pain and urgency.  Musculoskeletal: Positive for back pain. Negative for arthralgias, joint swelling and neck pain.  Skin: Negative for  rash.  Allergic/Immunologic: Negative for environmental allergies.  Neurological: Negative for dizziness, tremors, weakness, numbness and headaches.  Hematological: Negative for adenopathy. Does not bruise/bleed easily.  Psychiatric/Behavioral: Negative for behavioral problems (Depression), dysphoric mood, sleep disturbance and suicidal ideas. The patient is not nervous/anxious.     Today's Vitals   12/25/17 1439  BP: 129/79  Pulse: 76  Resp: 16  SpO2: 98%  Weight: 191 lb 6.4 oz (86.8 kg)  Height:  (1.626 m)     Physical Exam  Constitutional: She is oriented to person, place, and time. She appears well-developed and well-nourished. No distress.  HENT:  Head: Normocephalic and atraumatic.  Mouth/Throat: Oropharynx is clear and moist. No oropharyngeal exudate.  Eyes: Pupils are equal, round, and reactive to light. EOM are normal.  Neck: Normal range of motion. Neck supple. No JVD present. No tracheal deviation present. No thyromegaly present.  Cardiovascular: Normal rate, regular rhythm and normal heart sounds. Exam reveals no gallop and no friction rub.  No murmur heard. Pulmonary/Chest: Effort normal and breath sounds normal. No respiratory distress. She has no wheezes. She has no rales. She exhibits no tenderness. Right breast exhibits no inverted nipple, no mass, no nipple discharge, no skin change and no tenderness. Left breast exhibits no inverted nipple, no mass, no nipple discharge, no skin change and no tenderness.  Abdominal: Soft. Normal appearance and bowel sounds are normal. There is tenderness in the right upper quadrant and epigastric area. There is no rigidity, no guarding and no CVA tenderness. No hernia.  Genitourinary: Vagina normal and uterus normal.  Genitourinary Comments: There is no tenderness, masses, or organomegaly present during bimanual exam.   Musculoskeletal: Normal range of motion.  Lymphadenopathy:    She has no cervical adenopathy.  Neurological:  She is alert and oriented to person, place, and time. She displays normal reflexes. No cranial nerve deficit. Coordination normal.  Skin: Skin is warm and dry. Capillary refill takes less than 2 seconds. She is not diaphoretic.  Psychiatric: She has a normal mood and affect. Her behavior is normal. Judgment and thought content normal.  Nursing note and vitals reviewed.    LABS: Recent Results (from the past 2160 hour(s))  Urinalysis, Routine w reflex microscopic     Status: None   Collection Time: 12/25/17  3:39 PM  Result Value Ref Range   Specific Gravity, UA 1.022 1.005 - 1.030   pH, UA 5.0 5.0 - 7.5   Color, UA Yellow Yellow   Appearance Ur Clear Clear   Leukocytes, UA Negative Negative   Protein, UA Negative Negative/Trace   Glucose, UA Negative Negative   Ketones, UA Negative Negative   RBC, UA Negative Negative   Bilirubin, UA Negative Negative   Urobilinogen, Ur 0.2 0.2 - 1.0 mg/dL   Nitrite, UA Negative Negative   Microscopic Examination Comment     Comment: Microscopic not indicated and not performed.  Pap IG and HPV (high risk) DNA detection     Status: None   Collection Time: 12/25/17  3:40 PM  Result Value Ref Range   DIAGNOSIS: Comment     Comment: NEGATIVE FOR INTRAEPITHELIAL LESION OR MALIGNANCY. SPECIMEN REPROCESSED FOR INTERPRETATION.    Specimen adequacy: Comment     Comment: Satisfactory for evaluation. No  endocervical component is identified. The absence of an endocervical component was confirmed by an additional screening evaluation.    Clinician Provided ICD10 Comment     Comment: Z12.4   Performed by: Comment     Comment: Renette Butters, Supervisory Cytotechnologist (ASCP)   QC reviewed by: Comment     Comment: Elie Confer, Cytotechnologist (ASCP)   PAP Smear Comment .    Note: Comment     Comment: The Pap smear is a screening test designed to aid in the detection of premalignant and malignant conditions of the uterine cervix.  It is not  a diagnostic procedure and should not be used as the sole means of detecting cervical cancer.  Both false-positive and false-negative reports do occur.    Test Methodology CANCELED     Comment: The Thin Prep(R) Imager was unable to read this specimen.  Therefore a manual review was performed.  Result canceled by the ancillary.    HPV, high-risk Negative Negative    Comment: This high-risk HPV test detects thirteen high-risk types (16/18/31/33/35/39/45/51/52/56/58/59/68) without differentiation.   CBC with Differential/Platelet     Status: Abnormal   Collection Time: 12/26/17  7:33 AM  Result Value Ref Range   WBC 5.6 3.4 - 10.8 x10E3/uL   RBC 5.66 (H) 3.77 - 5.28 x10E6/uL   Hemoglobin 16.7 (H) 11.1 - 15.9 g/dL   Hematocrit 96.0 (H) 45.4 - 46.6 %   MCV 87 79 - 97 fL   MCH 29.5 26.6 - 33.0 pg   MCHC 34.1 31.5 - 35.7 g/dL   RDW 09.8 11.9 - 14.7 %   Platelets 230 150 - 379 x10E3/uL   Neutrophils 67 Not Estab. %   Lymphs 25 Not Estab. %   Monocytes 6 Not Estab. %   Eos 2 Not Estab. %   Basos 0 Not Estab. %   Neutrophils Absolute 3.7 1.4 - 7.0 x10E3/uL   Lymphocytes Absolute 1.4 0.7 - 3.1 x10E3/uL   Monocytes Absolute 0.4 0.1 - 0.9 x10E3/uL   EOS (ABSOLUTE) 0.1 0.0 - 0.4 x10E3/uL   Basophils Absolute 0.0 0.0 - 0.2 x10E3/uL   Immature Granulocytes 0 Not Estab. %   Immature Grans (Abs) 0.0 0.0 - 0.1 x10E3/uL  Comprehensive metabolic panel     Status: Abnormal   Collection Time: 12/26/17  7:33 AM  Result Value Ref Range   Glucose 97 65 - 99 mg/dL   BUN 12 8 - 27 mg/dL   Creatinine, Ser 8.29 0.57 - 1.00 mg/dL   GFR calc non Af Amer 76 >59 mL/min/1.73   GFR calc Af Amer 88 >59 mL/min/1.73   BUN/Creatinine Ratio 14 12 - 28   Sodium 143 134 - 144 mmol/L   Potassium 5.3 (H) 3.5 - 5.2 mmol/L   Chloride 104 96 - 106 mmol/L   CO2 24 20 - 29 mmol/L   Calcium 9.8 8.7 - 10.3 mg/dL   Total Protein 6.8 6.0 - 8.5 g/dL   Albumin 4.4 3.6 - 4.8 g/dL   Globulin, Total 2.4 1.5 - 4.5 g/dL    Albumin/Globulin Ratio 1.8 1.2 - 2.2   Bilirubin Total 0.4 0.0 - 1.2 mg/dL   Alkaline Phosphatase 100 39 - 117 IU/L   AST 20 0 - 40 IU/L   ALT 21 0 - 32 IU/L  T4, free     Status: None   Collection Time: 12/26/17  7:33 AM  Result Value Ref Range   Free T4 1.30 0.82 - 1.77 ng/dL  TSH  Status: Abnormal   Collection Time: 12/26/17  7:33 AM  Result Value Ref Range   TSH 0.384 (L) 0.450 - 4.500 uIU/mL  Lipid panel     Status: Abnormal   Collection Time: 12/26/17  7:33 AM  Result Value Ref Range   Cholesterol, Total 206 (H) 100 - 199 mg/dL   Triglycerides 621 (H) 0 - 149 mg/dL   HDL 62 >30 mg/dL   VLDL Cholesterol Cal 41 (H) 5 - 40 mg/dL   LDL Calculated 865 (H) 0 - 99 mg/dL   Chol/HDL Ratio 3.3 0.0 - 4.4 ratio    Comment:                                   T. Chol/HDL Ratio                                             Men  Women                               1/2 Avg.Risk  3.4    3.3                                   Avg.Risk  5.0    4.4                                2X Avg.Risk  9.6    7.1                                3X Avg.Risk 23.4   11.0   Vitamin D 1,25 dihydroxy     Status: None   Collection Time: 12/26/17  7:33 AM  Result Value Ref Range   Vitamin D 1, 25 (OH)2 Total 32 pg/mL    Comment: Reference Range: Adults: 21 - 65    Vitamin D2 1, 25 (OH)2 <10 pg/mL   Vitamin D3 1, 25 (OH)2 32 pg/mL    Assessment/Plan: 1. Encounter for general adult medical examination with abnormal findings Annual health maintenance exam with pap smear today.  - CBC with Differential/Platelet - Comprehensive metabolic panel - T4, free - TSH  2. Right upper quadrant abdominal pain Will get u/s of RUQ abdomen to evaluate for issues with gallbladders.  - US Abdomen Limited RUQ; Future  3. Essential hypertension Stable. Continue bp medication as prescribed.  - CBC with Differential/Platelet - Comprehensive metabolic panel  4. Mixed hyperlipidemia Check fasting lipid panel and adjust  lovastatin as indicated.  - Lipid panel  5. Obesity (BMI 30.0-34.9) Check thyroid panel for further evaluation.  - T4, free - TSH  6. Vitamin D deficiency - Vitamin D 1,25 dihydroxy  7. Dysuria - Urinalysis, Routine w reflex microscopic  8. Routine cervical smear - Pap IG and HPV (high risk) DNA detection  General Counseling: Bethannie verbalizes understanding of the findings of todays visit and agrees with plan of treatment. I have discussed any further diagnostic evaluation that Skop be needed or ordered today. We also reviewed her medications today. she has been encouraged to  call the office with any questions or concerns that should arise related to todays visit.  Hypertension Counseling:   The following hypertensive lifestyle modification were recommended and discussed:  1. Limiting alcohol intake to less than 1 oz/day of ethanol:(24 oz of beer or 8 oz of wine or 2 oz of 100-proof whiskey). 2. Take baby ASA 81 mg daily. 3. Importance of regular aerobic exercise and losing weight. 4. Reduce dietary saturated fat and cholesterol intake for overall cardiovascular health. 5. Maintaining adequate dietary potassium, calcium, and magnesium intake. 6. Regular monitoring of the blood pressure. 7. Reduce sodium intake to less than 100 mmol/day (less than 2.3 gm of sodium or less than 6 gm of sodium choride)   This patient was seen by Vincent Gros, FNP- C in Collaboration with Dr Lyndon Code as a part of collaborative care agreement    Orders Placed This Encounter  Procedures  . US Abdomen Limited RUQ  . Urinalysis, Routine w reflex microscopic  . CBC with Differential/Platelet  . Comprehensive metabolic panel  . T4, free  . TSH  . Lipid panel  . Vitamin D 1,25 dihydroxy    Time spent: 60 Minutes      Lyndon Code, MD  Internal Medicine

## 2017-12-26 LAB — URINALYSIS, ROUTINE W REFLEX MICROSCOPIC
Bilirubin, UA: NEGATIVE
Glucose, UA: NEGATIVE
Ketones, UA: NEGATIVE
Leukocytes, UA: NEGATIVE
Nitrite, UA: NEGATIVE
Protein, UA: NEGATIVE
RBC, UA: NEGATIVE
Specific Gravity, UA: 1.022 (ref 1.005–1.030)
Urobilinogen, Ur: 0.2 mg/dL (ref 0.2–1.0)
pH, UA: 5 (ref 5.0–7.5)

## 2018-01-01 LAB — COMPREHENSIVE METABOLIC PANEL
ALT: 21 IU/L (ref 0–32)
AST: 20 IU/L (ref 0–40)
Albumin/Globulin Ratio: 1.8 (ref 1.2–2.2)
Albumin: 4.4 g/dL (ref 3.6–4.8)
Alkaline Phosphatase: 100 IU/L (ref 39–117)
BUN/Creatinine Ratio: 14 (ref 12–28)
BUN: 12 mg/dL (ref 8–27)
Bilirubin Total: 0.4 mg/dL (ref 0.0–1.2)
CO2: 24 mmol/L (ref 20–29)
Calcium: 9.8 mg/dL (ref 8.7–10.3)
Chloride: 104 mmol/L (ref 96–106)
Creatinine, Ser: 0.83 mg/dL (ref 0.57–1.00)
GFR calc Af Amer: 88 mL/min/{1.73_m2} (ref >59)
GFR calc non Af Amer: 76 mL/min/{1.73_m2} (ref >59)
Globulin, Total: 2.4 g/dL (ref 1.5–4.5)
Glucose: 97 mg/dL (ref 65–99)
Potassium: 5.3 mmol/L — ABNORMAL HIGH (ref 3.5–5.2)
Sodium: 143 mmol/L (ref 134–144)
Total Protein: 6.8 g/dL (ref 6.0–8.5)

## 2018-01-01 LAB — VITAMIN D 1,25 DIHYDROXY
Vitamin D 1, 25 (OH)2 Total: 32 pg/mL
Vitamin D2 1, 25 (OH)2: <10 pg/mL
Vitamin D3 1, 25 (OH)2: 32 pg/mL

## 2018-01-01 LAB — LIPID PANEL
Chol/HDL Ratio: 3.3 ratio (ref 0.0–4.4)
Cholesterol, Total: 206 mg/dL — ABNORMAL HIGH (ref 100–199)
HDL: 62 mg/dL (ref >39)
LDL Calculated: 103 mg/dL — ABNORMAL HIGH (ref 0–99)
Triglycerides: 203 mg/dL — ABNORMAL HIGH (ref 0–149)
VLDL Cholesterol Cal: 41 mg/dL — ABNORMAL HIGH (ref 5–40)

## 2018-01-01 LAB — CBC WITH DIFFERENTIAL/PLATELET
Basophils Absolute: 0 10*3/uL (ref 0.0–0.2)
Basos: 0 %
EOS (ABSOLUTE): 0.1 10*3/uL (ref 0.0–0.4)
Eos: 2 %
Hematocrit: 49 % — ABNORMAL HIGH (ref 34.0–46.6)
Hemoglobin: 16.7 g/dL — ABNORMAL HIGH (ref 11.1–15.9)
Immature Grans (Abs): 0 10*3/uL (ref 0.0–0.1)
Immature Granulocytes: 0 %
Lymphocytes Absolute: 1.4 10*3/uL (ref 0.7–3.1)
Lymphs: 25 %
MCH: 29.5 pg (ref 26.6–33.0)
MCHC: 34.1 g/dL (ref 31.5–35.7)
MCV: 87 fL (ref 79–97)
Monocytes Absolute: 0.4 10*3/uL (ref 0.1–0.9)
Monocytes: 6 %
Neutrophils Absolute: 3.7 10*3/uL (ref 1.4–7.0)
Neutrophils: 67 %
Platelets: 230 10*3/uL (ref 150–379)
RBC: 5.66 x10E6/uL — ABNORMAL HIGH (ref 3.77–5.28)
RDW: 14.1 % (ref 12.3–15.4)
WBC: 5.6 10*3/uL (ref 3.4–10.8)

## 2018-01-01 LAB — T4, FREE: Free T4: 1.3 ng/dL (ref 0.82–1.77)

## 2018-01-01 LAB — TSH: TSH: 0.384 u[IU]/mL — ABNORMAL LOW (ref 0.450–4.500)

## 2018-01-02 LAB — PAP IG AND HPV HIGH-RISK
HPV, high-risk: NEGATIVE
PAP Smear Comment: 0

## 2018-01-14 DIAGNOSIS — Z0001 Encounter for general adult medical examination with abnormal findings: Secondary | ICD-10-CM | POA: Insufficient documentation

## 2018-01-14 DIAGNOSIS — E559 Vitamin D deficiency, unspecified: Secondary | ICD-10-CM | POA: Insufficient documentation

## 2018-01-14 DIAGNOSIS — E669 Obesity, unspecified: Secondary | ICD-10-CM | POA: Insufficient documentation

## 2018-01-14 DIAGNOSIS — R1011 Right upper quadrant pain: Secondary | ICD-10-CM | POA: Insufficient documentation

## 2018-01-14 DIAGNOSIS — I10 Essential (primary) hypertension: Secondary | ICD-10-CM | POA: Insufficient documentation

## 2018-01-14 DIAGNOSIS — Z124 Encounter for screening for malignant neoplasm of cervix: Secondary | ICD-10-CM

## 2018-01-14 DIAGNOSIS — E782 Mixed hyperlipidemia: Secondary | ICD-10-CM | POA: Insufficient documentation

## 2018-01-14 DIAGNOSIS — R300 Dysuria: Secondary | ICD-10-CM | POA: Insufficient documentation

## 2018-01-19 ENCOUNTER — Ambulatory Visit (INDEPENDENT_AMBULATORY_CARE_PROVIDER_SITE_OTHER): Payer: BLUE CROSS/BLUE SHIELD

## 2018-01-19 DIAGNOSIS — R1011 Right upper quadrant pain: Secondary | ICD-10-CM

## 2018-02-05 ENCOUNTER — Encounter: Payer: Self-pay | Admitting: Nurse Practitioner

## 2018-02-05 ENCOUNTER — Ambulatory Visit: Payer: BLUE CROSS/BLUE SHIELD | Admitting: Nurse Practitioner

## 2018-02-05 VITALS — BP 111/73 | HR 72 | Resp 16 | Ht 64.0 in | Wt 190.0 lb

## 2018-02-05 DIAGNOSIS — I10 Essential (primary) hypertension: Secondary | ICD-10-CM

## 2018-02-05 DIAGNOSIS — E782 Mixed hyperlipidemia: Secondary | ICD-10-CM

## 2018-02-05 DIAGNOSIS — R1011 Right upper quadrant pain: Secondary | ICD-10-CM

## 2018-02-05 DIAGNOSIS — E010 Iodine-deficiency related diffuse (endemic) goiter: Secondary | ICD-10-CM | POA: Insufficient documentation

## 2018-02-05 DIAGNOSIS — Z8673 Personal history of transient ischemic attack (TIA), and cerebral infarction without residual deficits: Secondary | ICD-10-CM

## 2018-02-05 DIAGNOSIS — E079 Disorder of thyroid, unspecified: Secondary | ICD-10-CM

## 2018-02-05 MED ORDER — CLOPIDOGREL BISULFATE 75 MG PO TABS: 75.0000 mg | ORAL_TABLET | Freq: Every day | ORAL | 4 refills | Status: DC

## 2018-02-05 MED ORDER — LOVASTATIN 40 MG PO TABS: 40.0000 mg | ORAL_TABLET | Freq: Every day | ORAL | 4 refills | Status: DC

## 2018-02-05 MED ORDER — AMLODIPINE BESYLATE 5 MG PO TABS
5.0000 mg | ORAL_TABLET | Freq: Every day | ORAL | 4 refills | Status: DC
Start: 2018-02-05 — End: 2019-02-11

## 2018-02-05 MED ORDER — BENAZEPRIL HCL 5 MG PO TABS
5.0000 mg | ORAL_TABLET | Freq: Every day | ORAL | 4 refills | Status: DC
Start: 2018-02-05 — End: 2019-02-11

## 2018-02-05 NOTE — Progress Notes (Signed)
[facility] [address] [place], [place] [address]  Internal MEDICINE  Office Visit Note  Patient Name: Valerie Thornton  [id number]  [id number]  Date of Service: 02/24/2018   Pt is here for routine follow up.    Chief Complaint  Patient presents with  . Other    followup for labs     This is a recurrent problem. The current episode started 1 to 4 weeks ago. The onset quality is gradual. The problem occurs intermittently. The problem has been unchanged. The pain is located in the RUQ. The pain is at a severity of 3/10. The pain is moderate. The quality of the pain is colicky. The abdominal pain radiates to the epigastric region, back and right shoulder. Associated symptoms include anorexia, belching and nausea. Pertinent negatives include no arthralgias, constipation, diarrhea, dysuria, frequency, headaches or vomiting. The pain is aggravated by eating and palpation. The pain is relieved by belching and passing flatus. She has tried antacids and H2 blockers for the symptoms. The treatment provided mild relief.      Current Medication: Outpatient Encounter Medications as of 02/05/2018  Medication Sig  . amLODipine (NORVASC) 5 MG tablet Take 1 tablet (5 mg total) by mouth daily.  . benazepril (LOTENSIN) 5 MG tablet Take 1 tablet (5 mg total) by mouth daily.  . clopidogrel (PLAVIX) 75 MG tablet Take 1 tablet (75 mg total) by mouth daily.  Marland Kitchen lovastatin (MEVACOR) 40 MG tablet Take 1 tablet (40 mg total) by mouth at bedtime.  . [DISCONTINUED] amLODipine (NORVASC) 5 MG tablet Take 1 tablet (5 mg total) by mouth daily.  . [DISCONTINUED] benazepril (LOTENSIN) 5 MG tablet Take 1 tablet (5 mg total) by mouth daily.  . [DISCONTINUED] clopidogrel (PLAVIX) 75 MG tablet Take 1 tablet (75 mg total) by mouth daily.  . [DISCONTINUED] lovastatin (MEVACOR) 40 MG tablet Take 1 tablet (40 mg total) by mouth at bedtime.   No facility-administered encounter medications on file as of 02/05/2018.      Surgical History: Past Surgical History:  Procedure Laterality Date  . TONSILLECTOMY AND ADENOIDECTOMY     pt was 62 years old    Medical History: Past Medical History:  Diagnosis Date  . Erythrocytosis   . Hypertension   . 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 TIA (transient ischemic attack) 2015   affected right side but completely resolved    Family History: Family History  Problem Relation Age of Onset  . Breast cancer Maternal Aunt 70  . Hypertension Mother   . Hypertension Sister     Social History   Socioeconomic History  . Marital status: Married    Spouse name: Not on file  . Number of children: Not on file  . Years of education: Not on file  . Highest education level: Not on file  Occupational History  . Not on file  Social Needs  . Financial resource strain: Not on file  . Food insecurity:    Worry: Not on file    Inability: Not on file  . Transportation needs:    Medical: Not on file    Non-medical: Not on file  Tobacco Use  . Smoking status: Never Smoker  . Smokeless tobacco: Never Used  Substance and Sexual Activity  . Alcohol use: Yes    Comment: glass once a month  . Drug use: No  . Sexual activity: Not on file  Lifestyle  .  Physical activity:    Days per week: Not on file    Minutes per session: Not on file  . Stress: Not on file  Relationships  . Social connections:    Talks on phone: Not on file    Gets together: Not on file    Attends religious service: Not on file    Active member of club or organization: Not on file    Attends meetings of clubs or organizations: Not on file    Relationship status: Not on file  . Intimate partner violence:    Fear of current or ex partner: Not on file    Emotionally abused: Not on file    Physically abused: Not on file    Forced sexual activity: Not on file  Other Topics Concern  . Not on file  Social History Narrative  . Not on file       Review of Systems  Constitutional: Positive for appetite change. Negative for activity change, chills, fatigue and unexpected weight change.  HENT: Negative for congestion, postnasal drip, rhinorrhea, sneezing and sore throat.   Eyes: Negative.  Negative for redness.  Respiratory: Negative for cough, chest tightness, shortness of breath and wheezing.   Cardiovascular: Negative for chest pain and palpitations.  Gastrointestinal: Positive for abdominal distention, abdominal pain and nausea. Negative for constipation, diarrhea and vomiting.       Bloating and excess gas.   Endocrine: Negative for cold intolerance, heat intolerance, polydipsia, polyphagia and polyuria.  Genitourinary: Negative for dysuria, frequency, pelvic pain and urgency.  Musculoskeletal: Positive for back pain. Negative for arthralgias, joint swelling and neck pain.  Skin: Negative for rash.  Allergic/Immunologic: Negative for environmental allergies.  Neurological: Negative for dizziness, tremors, weakness, numbness and headaches.  Hematological: Negative for adenopathy. Does not bruise/bleed easily.  Psychiatric/Behavioral: Negative for behavioral problems (Depression), dysphoric mood, sleep disturbance and suicidal ideas. The patient is not nervous/anxious.     Today's Vitals   02/05/18 1429  BP: 111/73  Pulse: 72  Resp: 16  SpO2: 96%  Weight: 190 lb (86.2 kg)  Height: 5\' 4"  (1.626 m)    Physical Exam  Constitutional: She is oriented to person, place, and time. She appears well-developed and well-nourished. No distress.  HENT:  Head: Normocephalic and atraumatic.  Mouth/Throat: Oropharynx is clear and moist. No oropharyngeal exudate.  Eyes: Pupils are equal, round, and reactive to light. EOM are normal.  Neck: Normal range of motion. Neck supple. No JVD present. No tracheal deviation present. No thyromegaly present.  Cardiovascular: Normal rate, regular rhythm and normal heart sounds. Exam reveals no  gallop and no friction rub.  No murmur heard. Pulmonary/Chest: Effort normal and breath sounds normal. No respiratory distress. She has no wheezes. She has no rales. She exhibits no tenderness.  Abdominal: Soft. Normal appearance and bowel sounds are normal. There is tenderness in the right upper quadrant and epigastric area. There is no rigidity, no guarding and no CVA tenderness. No hernia.  Musculoskeletal: Normal range of motion.  Lymphadenopathy:    She has no cervical adenopathy.  Neurological: She is alert and oriented to person, place, and time. She displays normal reflexes. No cranial nerve deficit. Coordination normal.  Skin: Skin is warm and dry. Capillary refill takes less than 2 seconds. She is not diaphoretic.  Psychiatric: She has a normal mood and affect. Her behavior is normal. Judgment and thought content normal.  Nursing note and vitals reviewed.   Assessment/Plan: 1. Right upper quadrant abdominal pain Reviewed  u/s with RUQ which is showing no remarkable abnormalities. Will continue to monitor.   2. Essential hypertension Well controlled. Continue BP medication as prescribed.  - amLODipine (NORVASC) 5 MG tablet; Take 1 tablet (5 mg total) by mouth daily.  Dispense: 90 tablet; Refill: 4 - benazepril (LOTENSIN) 5 MG tablet; Take 1 tablet (5 mg total) by mouth daily.  Dispense: 90 tablet; Refill: 4  3. Mixed hyperlipidemia Cholesterol panel stable. Continue lovastatin as prescribed.  - lovastatin (MEVACOR) 40 MG tablet; Take 1 tablet (40 mg total) by mouth at bedtime.  Dispense: 90 tablet; Refill: 4  4. History of TIAs - clopidogrel (PLAVIX) 75 MG tablet; Take 1 tablet (75 mg total) by mouth daily.  Dispense: 90 tablet; Refill: 4    General Counseling: Kathyrn LassKaren verbalizes understanding of the findings of todays visit and agrees with plan of treatment. I have discussed any further diagnostic evaluation that Corle be needed or ordered today. We also reviewed her medications  today. she has been encouraged to call the office with any questions or concerns that should arise related to todays visit.    Counseling:  This patient was seen by Vincent GrosHeather Ludger Bones, FNP- C in Collaboration with Dr Lyndon CodeFozia M Khan as a part of collaborative care agreement  Meds ordered this encounter  Medications  . amLODipine (NORVASC) 5 MG tablet    Sig: Take 1 tablet (5 mg total) by mouth daily.    Dispense:  90 tablet    Refill:  4    Order Specific Question:   Supervising Provider    Answer:   Lyndon CodeKHAN, FOZIA M [[id number]]  . benazepril (LOTENSIN) 5 MG tablet    Sig: Take 1 tablet (5 mg total) by mouth daily.    Dispense:  90 tablet    Refill:  4    Order Specific Question:   Supervising Provider    Answer:   Lyndon CodeKHAN, FOZIA M [[id number]]  . clopidogrel (PLAVIX) 75 MG tablet    Sig: Take 1 tablet (75 mg total) by mouth daily.    Dispense:  90 tablet    Refill:  4    Order Specific Question:   Supervising Provider    Answer:   Lyndon CodeKHAN, FOZIA M [[id number]]  . lovastatin (MEVACOR) 40 MG tablet    Sig: Take 1 tablet (40 mg total) by mouth at bedtime.    Dispense:  90 tablet    Refill:  4    Order Specific Question:   Supervising Provider    Answer:   Lyndon CodeKHAN, FOZIA M [[id number]]    Time spent: 7520 Minutes       Dr Lyndon CodeFozia M Khan Internal medicine

## 2018-04-06 ENCOUNTER — Other Ambulatory Visit: Payer: Self-pay | Admitting: Nurse Practitioner

## 2018-04-07 LAB — TSH: TSH: 0.523 u[IU]/mL (ref 0.450–4.500)

## 2018-04-07 LAB — T4, FREE: Free T4: 1.22 ng/dL (ref 0.82–1.77)

## 2018-08-09 ENCOUNTER — Ambulatory Visit: Payer: BLUE CROSS/BLUE SHIELD | Admitting: Nurse Practitioner

## 2018-08-09 ENCOUNTER — Encounter: Payer: Self-pay | Admitting: Nurse Practitioner

## 2018-08-09 VITALS — BP 122/73 | HR 70 | Resp 16 | Ht 64.0 in | Wt 187.0 lb

## 2018-08-09 DIAGNOSIS — E782 Mixed hyperlipidemia: Secondary | ICD-10-CM | POA: Diagnosis not present

## 2018-08-09 DIAGNOSIS — Z8673 Personal history of transient ischemic attack (TIA), and cerebral infarction without residual deficits: Secondary | ICD-10-CM | POA: Diagnosis not present

## 2018-08-09 DIAGNOSIS — E079 Disorder of thyroid, unspecified: Secondary | ICD-10-CM

## 2018-08-09 DIAGNOSIS — I10 Essential (primary) hypertension: Secondary | ICD-10-CM

## 2018-08-09 NOTE — Progress Notes (Signed)
[facility] [address] [place], [place] [address]  Internal MEDICINE  Office Visit Note  Patient Name: Valerie Thornton  [id number]  [id number]  Date of Service: 08/12/2018  Chief Complaint  Patient presents with  . Hypertension  . Follow-up  . Quality Metric Gaps    cologuard    The patient is here for routine follow up exam. Blood pressure is well controlled. Doing well with current medications. Has no concerns or complaints today. She is due for a flu shot, and will get this from her pharmacy. Rechecked thyroid panel prior to this visit. Results were normal.  Interested in Keto-Booster supplement to help boost her metabolism. She has lost 3 pounds since the last time she was seen. She is eating a low-calorie diet and exercises for 30 minutes, three to four times per week.      Current Medication: Outpatient Encounter Medications as of 08/09/2018  Medication Sig  . amLODipine (NORVASC) 5 MG tablet Take 1 tablet (5 mg total) by mouth daily.  . benazepril (LOTENSIN) 5 MG tablet Take 1 tablet (5 mg total) by mouth daily.  . clopidogrel (PLAVIX) 75 MG tablet Take 1 tablet (75 mg total) by mouth daily.  Marland Kitchen lovastatin (MEVACOR) 40 MG tablet Take 1 tablet (40 mg total) by mouth at bedtime.   No facility-administered encounter medications on file as of 08/09/2018.     Surgical History: Past Surgical History:  Procedure Laterality Date  . TONSILLECTOMY AND ADENOIDECTOMY     pt was 62 years old    Medical History: Past Medical History:  Diagnosis Date  . Erythrocytosis   . Hypertension   . 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 TIA (transient ischemic attack) 2015   affected right side but completely resolved    Family History: Family History  Problem Relation Age of Onset  . Breast cancer Maternal Aunt 70  . Hypertension Mother   . Hypertension Sister     Social History   Socioeconomic History  . Marital  status: Married    Spouse name: Not on file  . Number of children: Not on file  . Years of education: Not on file  . Highest education level: Not on file  Occupational History  . Not on file  Social Needs  . Financial resource strain: Not on file  . Food insecurity:    Worry: Not on file    Inability: Not on file  . Transportation needs:    Medical: Not on file    Non-medical: Not on file  Tobacco Use  . Smoking status: Never Smoker  . Smokeless tobacco: Never Used  Substance and Sexual Activity  . Alcohol use: Yes    Comment: glass once a month  . Drug use: No  . Sexual activity: Not on file  Lifestyle  . Physical activity:    Days per week: Not on file    Minutes per session: Not on file  . Stress: Not on file  Relationships  . Social connections:    Talks on phone: Not on file    Gets together: Not on file    Attends religious service: Not on file    Active member of club or organization: Not on file    Attends meetings of clubs or organizations: Not on file    Relationship status: Not on file  . Intimate partner violence:    Fear of current or  ex partner: Not on file    Emotionally abused: Not on file    Physically abused: Not on file    Forced sexual activity: Not on file  Other Topics Concern  . Not on file  Social History Narrative  . Not on file      Review of Systems  Constitutional: Negative for chills, fatigue and unexpected weight change.       Three pound weight loss since her last visit .  HENT: Negative for congestion, postnasal drip, rhinorrhea, sneezing and sore throat.   Respiratory: Negative for cough, chest tightness, shortness of breath and wheezing.   Cardiovascular: Negative for chest pain and palpitations.  Gastrointestinal: Negative for abdominal pain, constipation, diarrhea, nausea and vomiting.  Endocrine: Negative for cold intolerance, heat intolerance, polydipsia and polyuria.       Last thyroid panel, checked in 04/2018, was within  normal limits.   Musculoskeletal: Negative for arthralgias, back pain, joint swelling and neck pain.  Skin: Negative for rash.  Neurological: Negative.  Negative for tremors and numbness.  Hematological: Negative for adenopathy. Does not bruise/bleed easily.  Psychiatric/Behavioral: Negative for behavioral problems (Depression), sleep disturbance and suicidal ideas. The patient is not nervous/anxious.     Today's Vitals   08/09/18 1358  BP: 122/73  Pulse: 70  Resp: 16  SpO2: 98%  Weight: 187 lb (84.8 kg)  Height: 5\' 4"  (1.626 m)    Physical Exam Vitals signs and nursing note reviewed.  Constitutional:      General: She is not in acute distress.    Appearance: Normal appearance. She is well-developed. She is not diaphoretic.  HENT:     Head: Normocephalic and atraumatic.     Nose: Nose normal.     Mouth/Throat:     Mouth: Mucous membranes are moist.     Pharynx: No oropharyngeal exudate.  Eyes:     Extraocular Movements: Extraocular movements intact.     Conjunctiva/sclera: Conjunctivae normal.     Pupils: Pupils are equal, round, and reactive to light.  Neck:     Musculoskeletal: Normal range of motion and neck supple.     Thyroid: No thyromegaly.     Vascular: No carotid bruit or JVD.     Trachea: No tracheal deviation.  Cardiovascular:     Rate and Rhythm: Normal rate and regular rhythm.     Heart sounds: Normal heart sounds. No murmur. No friction rub. No gallop.   Pulmonary:     Effort: Pulmonary effort is normal. No respiratory distress.     Breath sounds: Normal breath sounds. No wheezing or rales.  Chest:     Chest wall: No tenderness.  Abdominal:     General: Bowel sounds are normal.     Palpations: Abdomen is soft. Abdomen is not rigid.     Tenderness: There is no abdominal tenderness. There is no guarding.     Hernia: No hernia is present.  Musculoskeletal: Normal range of motion.  Lymphadenopathy:     Cervical: No cervical adenopathy.  Skin:     General: Skin is warm and dry.     Capillary Refill: Capillary refill takes less than 2 seconds.  Neurological:     General: No focal deficit present.     Mental Status: She is alert and oriented to person, place, and time.     Cranial Nerves: No cranial nerve deficit.     Coordination: Coordination normal.     Deep Tendon Reflexes: Reflexes normal.  Psychiatric:  Mood and Affect: Mood normal.        Behavior: Behavior normal.        Thought Content: Thought content normal.        Judgment: Judgment normal.   Assessment/Plan: 1. Essential hypertension Stable. Continue bp medication as prescribed   2. Mixed hyperlipidemia Most recent lipid panel looked good. Continue lovastatin as prescribed   3. Thyroid disorder Recent thyroid panel within normal limits. Continue levothyroxine as prescribed   4. History of TIAs Continue plavix as prescribed   General Counseling: Kathyrn LassKaren verbalizes understanding of the findings of todays visit and agrees with plan of treatment. I have discussed any further diagnostic evaluation that Costello be needed or ordered today. We also reviewed her medications today. she has been encouraged to call the office with any questions or concerns that should arise related to todays visit.  Hypertension Counseling:   The following hypertensive lifestyle modification were recommended and discussed:  1. Limiting alcohol intake to less than 1 oz/day of ethanol:(24 oz of beer or 8 oz of wine or 2 oz of 100-proof whiskey). 2. Take baby ASA 81 mg daily. 3. Importance of regular aerobic exercise and losing weight. 4. Reduce dietary saturated fat and cholesterol intake for overall cardiovascular health. 5. Maintaining adequate dietary potassium, calcium, and magnesium intake. 6. Regular monitoring of the blood pressure. 7. Reduce sodium intake to less than 100 mmol/day (less than 2.3 gm of sodium or less than 6 gm of sodium choride)   This patient was seen by Vincent GrosHeather  Furman Trentman FNP Collaboration with Dr Lyndon CodeFozia M Khan as a part of collaborative care agreement   Time spent: 25 Minutes    Dr Lyndon CodeFozia M Khan Internal medicine

## 2018-10-30 ENCOUNTER — Other Ambulatory Visit: Payer: Self-pay | Admitting: Nurse Practitioner

## 2018-10-30 DIAGNOSIS — Z1231 Encounter for screening mammogram for malignant neoplasm of breast: Secondary | ICD-10-CM

## 2019-02-01 ENCOUNTER — Other Ambulatory Visit: Payer: Self-pay | Admitting: Nurse Practitioner

## 2019-02-01 ENCOUNTER — Ambulatory Visit
Admission: RE | Admit: 2019-02-01 | Discharge: 2019-02-01 | Disposition: A | Discharge status: Home / Self Care | Payer: PRIVATE HEALTH INSURANCE | Source: Ambulatory Visit | Attending: Nurse Practitioner | Admitting: Nurse Practitioner

## 2019-02-01 ENCOUNTER — Other Ambulatory Visit: Payer: Self-pay

## 2019-02-01 DIAGNOSIS — Z1231 Encounter for screening mammogram for malignant neoplasm of breast: Secondary | ICD-10-CM | POA: Insufficient documentation

## 2019-02-02 LAB — COMPREHENSIVE METABOLIC PANEL
ALT: 20 IU/L (ref 0–32)
AST: 22 IU/L (ref 0–40)
Albumin/Globulin Ratio: 1.6 (ref 1.2–2.2)
Albumin: 4.2 g/dL (ref 3.8–4.8)
Alkaline Phosphatase: 100 IU/L (ref 39–117)
BUN/Creatinine Ratio: 19 (ref 12–28)
BUN: 14 mg/dL (ref 8–27)
Bilirubin Total: 0.3 mg/dL (ref 0.0–1.2)
CO2: 22 mmol/L (ref 20–29)
Calcium: 9.7 mg/dL (ref 8.7–10.3)
Chloride: 105 mmol/L (ref 96–106)
Creatinine, Ser: 0.75 mg/dL (ref 0.57–1.00)
GFR calc Af Amer: 98 mL/min/{1.73_m2} (ref >59)
GFR calc non Af Amer: 85 mL/min/{1.73_m2} (ref >59)
Globulin, Total: 2.6 g/dL (ref 1.5–4.5)
Glucose: 107 mg/dL — ABNORMAL HIGH (ref 65–99)
Potassium: 4.7 mmol/L (ref 3.5–5.2)
Sodium: 144 mmol/L (ref 134–144)
Total Protein: 6.8 g/dL (ref 6.0–8.5)

## 2019-02-02 LAB — CBC
Hematocrit: 49.2 % — ABNORMAL HIGH (ref 34.0–46.6)
Hemoglobin: 16.7 g/dL — ABNORMAL HIGH (ref 11.1–15.9)
MCH: 29.2 pg (ref 26.6–33.0)
MCHC: 33.9 g/dL (ref 31.5–35.7)
MCV: 86 fL (ref 79–97)
Platelets: 253 10*3/uL (ref 150–450)
RBC: 5.71 x10E6/uL — ABNORMAL HIGH (ref 3.77–5.28)
RDW: 13.3 % (ref 11.7–15.4)
WBC: 5.2 10*3/uL (ref 3.4–10.8)

## 2019-02-02 LAB — LIPID PANEL W/O CHOL/HDL RATIO
Cholesterol, Total: 187 mg/dL (ref 100–199)
HDL: 65 mg/dL (ref >39)
LDL Calculated: 99 mg/dL (ref 0–99)
Triglycerides: 117 mg/dL (ref 0–149)
VLDL Cholesterol Cal: 23 mg/dL (ref 5–40)

## 2019-02-02 LAB — T4, FREE: Free T4: 1.01 ng/dL (ref 0.82–1.77)

## 2019-02-02 LAB — TSH: TSH: 0.423 u[IU]/mL — ABNORMAL LOW (ref 0.450–4.500)

## 2019-02-02 LAB — T3: T3, Total: 105 ng/dL (ref 71–180)

## 2019-02-02 LAB — VITAMIN D 25 HYDROXY (VIT D DEFICIENCY, FRACTURES): Vit D, 25-Hydroxy: 41.3 ng/mL (ref 30.0–100.0)

## 2019-02-11 ENCOUNTER — Ambulatory Visit: Payer: Self-pay | Admitting: Nurse Practitioner

## 2019-02-11 ENCOUNTER — Other Ambulatory Visit: Payer: Self-pay

## 2019-02-11 ENCOUNTER — Encounter: Payer: Self-pay | Admitting: Nurse Practitioner

## 2019-02-11 VITALS — BP 134/79 | HR 71 | Resp 16 | Ht 64.0 in | Wt 193.0 lb

## 2019-02-11 DIAGNOSIS — I10 Essential (primary) hypertension: Secondary | ICD-10-CM

## 2019-02-11 DIAGNOSIS — E782 Mixed hyperlipidemia: Secondary | ICD-10-CM

## 2019-02-11 DIAGNOSIS — Z0001 Encounter for general adult medical examination with abnormal findings: Secondary | ICD-10-CM

## 2019-02-11 DIAGNOSIS — R300 Dysuria: Secondary | ICD-10-CM

## 2019-02-11 DIAGNOSIS — Z8673 Personal history of transient ischemic attack (TIA), and cerebral infarction without residual deficits: Secondary | ICD-10-CM

## 2019-02-11 MED ORDER — AMLODIPINE BESYLATE 5 MG PO TABS: 5.0000 mg | ORAL_TABLET | Freq: Every day | ORAL | 3 refills | Status: DC

## 2019-02-11 MED ORDER — BENAZEPRIL HCL 5 MG PO TABS: 5.0000 mg | ORAL_TABLET | Freq: Every day | ORAL | 3 refills | Status: DC

## 2019-02-11 MED ORDER — LOVASTATIN 40 MG PO TABS: 40.0000 mg | ORAL_TABLET | Freq: Every day | ORAL | 3 refills | Status: DC

## 2019-02-11 MED ORDER — CLOPIDOGREL BISULFATE 75 MG PO TABS: 75.0000 mg | ORAL_TABLET | Freq: Every day | ORAL | 3 refills | Status: DC

## 2019-02-11 NOTE — Progress Notes (Signed)
[facility] [address] [place], [place] [id number]  Internal MEDICINE  Office Visit Note  Patient Name: Valerie HoesKaren A Schaab  [id number]  [id number]  Date of Service: 02/13/2019   Pt is here for routine health maintenance examination  Chief Complaint  Patient presents with  . Annual Exam  . Hypertension     The patient is here for health maintenance exam. She has no concerns or complaints today. Unhappy about four pound weight gain. Discussed diet and lifestyle changes needed to help weight loss. Recently had fasting labs done. Hgb and Hct were mildly elevated, however, they are stable. Mammogram done last week and was negative. She is due to have colon cancer screening. She prefers to have Cologuard. She has no family history of colon cancer.   Current Medication: Outpatient Encounter Medications as of 02/11/2019  Medication Sig  . amLODipine (NORVASC) 5 MG tablet Take 1 tablet (5 mg total) by mouth daily.  . benazepril (LOTENSIN) 5 MG tablet Take 1 tablet (5 mg total) by mouth daily.  . clopidogrel (PLAVIX) 75 MG tablet Take 1 tablet (75 mg total) by mouth daily.  Marland Kitchen. lovastatin (MEVACOR) 40 MG tablet Take 1 tablet (40 mg total) by mouth at bedtime.  . [DISCONTINUED] amLODipine (NORVASC) 5 MG tablet Take 1 tablet (5 mg total) by mouth daily.  . [DISCONTINUED] benazepril (LOTENSIN) 5 MG tablet Take 1 tablet (5 mg total) by mouth daily.  . [DISCONTINUED] clopidogrel (PLAVIX) 75 MG tablet Take 1 tablet (75 mg total) by mouth daily.  . [DISCONTINUED] lovastatin (MEVACOR) 40 MG tablet Take 1 tablet (40 mg total) by mouth at bedtime.   No facility-administered encounter medications on file as of 02/11/2019.     Surgical History: Past Surgical History:  Procedure Laterality Date  . TONSILLECTOMY AND ADENOIDECTOMY     pt was 63 years old    Medical History: Past Medical History:  Diagnosis Date  . Erythrocytosis   . Hypertension   . Migraines    per pt it was hormonal and  food caused, hardly anymore she has one  . TIA (transient ischemic attack) 2015   affected right side but completely resolved    Family History: Family History  Problem Relation Age of Onset  . Breast cancer Maternal Aunt 70  . Hypertension Mother   . Hypertension Sister       Review of Systems  Constitutional: Negative for activity change, chills, fatigue and unexpected weight change.       Three pound weight loss since her last visit .  HENT: Negative for congestion, postnasal drip, rhinorrhea, sneezing and sore throat.   Respiratory: Negative for cough, chest tightness, shortness of breath and wheezing.   Cardiovascular: Negative for chest pain and palpitations.  Gastrointestinal: Negative for abdominal pain, constipation, diarrhea, nausea and vomiting.  Endocrine: Negative for cold intolerance, heat intolerance, polydipsia and polyuria.       Recent thyroid sowed mild elevation of TSH with normal T3 and free T4  Genitourinary: Negative for dysuria, frequency, hematuria and urgency.  Musculoskeletal: Negative for arthralgias, back pain, joint swelling and neck pain.  Skin: Negative for rash.  Allergic/Immunologic: Negative for environmental allergies.  Neurological: Negative for dizziness, tremors, numbness and headaches.  Hematological: Negative for adenopathy. Does not bruise/bleed easily.  Psychiatric/Behavioral: Negative for behavioral problems (Depression), sleep disturbance and suicidal ideas. The patient is not nervous/anxious.      Today's Vitals   02/11/19 1530  BP: 134/79  Pulse: 71  Resp: 16  SpO2: 99%  Weight: 193 lb (87.5 kg)  Height: 5\' 4"  (1.626 m)   Body mass index is 33.13 kg/m.  Physical Exam Vitals signs and nursing note reviewed.  Constitutional:      General: She is not in acute distress.    Appearance: Normal appearance. She is well-developed. She is not diaphoretic.  HENT:     Head: Normocephalic and atraumatic.     Nose: Nose normal.      Mouth/Throat:     Pharynx: No oropharyngeal exudate.  Eyes:     Extraocular Movements: Extraocular movements intact.     Conjunctiva/sclera: Conjunctivae normal.     Pupils: Pupils are equal, round, and reactive to light.  Neck:     Musculoskeletal: Normal range of motion and neck supple.     Thyroid: No thyromegaly.     Vascular: No carotid bruit or JVD.     Trachea: No tracheal deviation.  Cardiovascular:     Rate and Rhythm: Normal rate and regular rhythm.     Pulses: Normal pulses.     Heart sounds: Normal heart sounds. No murmur. No friction rub. No gallop.   Pulmonary:     Effort: Pulmonary effort is normal. No respiratory distress.     Breath sounds: Normal breath sounds. No wheezing or rales.  Chest:     Chest wall: No tenderness.     Breasts:        Right: Normal. No swelling, bleeding, inverted nipple, mass, nipple discharge, skin change or tenderness.        Left: Normal. No swelling, bleeding, inverted nipple, mass, nipple discharge, skin change or tenderness.  Abdominal:     General: Bowel sounds are normal.     Palpations: Abdomen is soft. Abdomen is not rigid.     Tenderness: There is no abdominal tenderness. There is no guarding.     Hernia: No hernia is present.  Musculoskeletal: Normal range of motion.  Lymphadenopathy:     Cervical: No cervical adenopathy.  Skin:    General: Skin is warm and dry.     Capillary Refill: Capillary refill takes less than 2 seconds.  Neurological:     General: No focal deficit present.     Mental Status: She is alert and oriented to person, place, and time. Mental status is at baseline.     Cranial Nerves: No cranial nerve deficit.     Coordination: Coordination normal.     Deep Tendon Reflexes: Reflexes normal.  Psychiatric:        Behavior: Behavior normal.        Thought Content: Thought content normal.        Judgment: Judgment normal.      LABS: Recent Results (from the past 2160 hour(s))  Comprehensive metabolic  panel     Status: Abnormal   Collection Time: 02/01/19  7:20 AM  Result Value Ref Range   Glucose 107 (H) 65 - 99 mg/dL   BUN 14 8 - 27 mg/dL   Creatinine, Ser 1.610.75 0.57 - 1.00 mg/dL   GFR calc non Af Amer 85 >59 mL/min/1.73   GFR calc Af Amer 98 >59 mL/min/1.73   BUN/Creatinine Ratio 19 12 - 28   Sodium 144 134 - 144 mmol/L   Potassium 4.7 3.5 - 5.2 mmol/L   Chloride 105 96 - 106 mmol/L   CO2 22 20 - 29 mmol/L   Calcium 9.7 8.7 - 10.3 mg/dL   Total Protein 6.8 6.0 - 8.5 g/dL  Albumin 4.2 3.8 - 4.8 g/dL   Globulin, Total 2.6 1.5 - 4.5 g/dL   Albumin/Globulin Ratio 1.6 1.2 - 2.2   Bilirubin Total 0.3 0.0 - 1.2 mg/dL   Alkaline Phosphatase 100 39 - 117 IU/L   AST 22 0 - 40 IU/L   ALT 20 0 - 32 IU/L  CBC     Status: Abnormal   Collection Time: 02/01/19  7:20 AM  Result Value Ref Range   WBC 5.2 3.4 - 10.8 x10E3/uL   RBC 5.71 (H) 3.77 - 5.28 x10E6/uL   Hemoglobin 16.7 (H) 11.1 - 15.9 g/dL   Hematocrit 16.149.2 (H) 09.634.0 - 46.6 %   MCV 86 79 - 97 fL   MCH 29.2 26.6 - 33.0 pg   MCHC 33.9 31.5 - 35.7 g/dL   RDW 04.513.3 40.911.7 - 81.115.4 %   Platelets 253 150 - 450 x10E3/uL  Lipid Panel w/o Chol/HDL Ratio     Status: None   Collection Time: 02/01/19  7:20 AM  Result Value Ref Range   Cholesterol, Total 187 100 - 199 mg/dL   Triglycerides 914117 0 - 149 mg/dL   HDL 65 >78>39 mg/dL   VLDL Cholesterol Cal 23 5 - 40 mg/dL   LDL Calculated 99 0 - 99 mg/dL  T4, free     Status: None   Collection Time: 02/01/19  7:20 AM  Result Value Ref Range   Free T4 1.01 0.82 - 1.77 ng/dL  TSH     Status: Abnormal   Collection Time: 02/01/19  7:20 AM  Result Value Ref Range   TSH 0.423 (L) 0.450 - 4.500 uIU/mL  VITAMIN D 25 Hydroxy (Vit-D Deficiency, Fractures)     Status: None   Collection Time: 02/01/19  7:20 AM  Result Value Ref Range   Vit D, 25-Hydroxy 41.3 30.0 - 100.0 ng/mL    Comment: Vitamin D deficiency has been defined by the Institute of Medicine and an Endocrine Society practice guideline as  a level of serum 25-OH vitamin D less than 20 ng/mL (1,2). The Endocrine Society went on to further define vitamin D insufficiency as a level between 21 and 29 ng/mL (2). 1. IOM (Institute of Medicine). 2010. Dietary reference    intakes for calcium and D. [place]: The    [employer]. 2. Holick MF, Binkley [place], Bischoff-Ferrari HA, et al.    Evaluation, treatment, and prevention of vitamin D    deficiency: an Endocrine Society clinical practice    guideline. JCEM. 2011 Jul; 96(7):1911-30.   T3     Status: None   Collection Time: 02/01/19  7:20 AM  Result Value Ref Range   T3, Total 105 71 - 180 ng/dL  UA/M w/rflx Culture, Routine     Status: None   Collection Time: 02/11/19  3:35 PM   Specimen: Urine   URINE  Result Value Ref Range   Specific Gravity, UA 1.005 1.005 - 1.030   pH, UA 7.5 5.0 - 7.5   Color, UA Yellow Yellow   Appearance Ur Clear Clear   Leukocytes,UA Negative Negative   Protein,UA Negative Negative/Trace   Glucose, UA Negative Negative   Ketones, UA Negative Negative   RBC, UA Negative Negative   Bilirubin, UA Negative Negative   Urobilinogen, Ur 0.2 0.2 - 1.0 mg/dL   Nitrite, UA Negative Negative   Microscopic Examination Comment     Comment: Microscopic follows if indicated.   Microscopic Examination See below:     Comment: Microscopic  was indicated and was performed.   Urinalysis Reflex Comment     Comment: This specimen will not reflex to a Urine Culture.  Microscopic Examination     Status: None   Collection Time: 02/11/19  3:35 PM   URINE  Result Value Ref Range   WBC, UA 0-5 0 - 5 /hpf   RBC 0-2 0 - 2 /hpf   Epithelial Cells (non renal) 0-10 0 - 10 /hpf   Casts None seen None seen /lpf   Mucus, UA Present Not Estab.   Bacteria, UA None seen None seen/Few   Assessment/Plan: 1. Encounter for general adult medical examination with abnormal findings Annual health maintnenace exam today  2. Essential hypertension Stable.  Continue blood pressure medication as prescribed. Refills provided today.  - amLODipine (NORVASC) 5 MG tablet; Take 1 tablet (5 mg total) by mouth daily.  Dispense: 90 tablet; Refill: 3 - benazepril (LOTENSIN) 5 MG tablet; Take 1 tablet (5 mg total) by mouth daily.  Dispense: 90 tablet; Refill: 3  3. Mixed hyperlipidemia Fasting lipid panel stable. Continue lovastatin as prescribed  - lovastatin (MEVACOR) 40 MG tablet; Take 1 tablet (40 mg total) by mouth at bedtime.  Dispense: 90 tablet; Refill: 3  4. History of TIAs Continue plavix as prescribed  - clopidogrel (PLAVIX) 75 MG tablet; Take 1 tablet (75 mg total) by mouth daily.  Dispense: 90 tablet; Refill: 3  5. Dysuria - UA/M w/rflx Culture, Routine  General Counseling: viva gallaher understanding of the findings of todays visit and agrees with plan of treatment. I have discussed any further diagnostic evaluation that Diedrich be needed or ordered today. We also reviewed her medications today. she has been encouraged to call the office with any questions or concerns that should arise related to todays visit.    Counseling:  Hypertension Counseling:   The following hypertensive lifestyle modification were recommended and discussed:  1. Limiting alcohol intake to less than 1 oz/day of ethanol:(24 oz of beer or 8 oz of wine or 2 oz of 100-proof whiskey). 2. Take baby ASA 81 mg daily. 3. Importance of regular aerobic exercise and losing weight. 4. Reduce dietary saturated fat and cholesterol intake for overall cardiovascular health. 5. Maintaining adequate dietary potassium, calcium, and magnesium intake. 6. Regular monitoring of the blood pressure. 7. Reduce sodium intake to less than 100 mmol/day (less than 2.3 gm of sodium or less than 6 gm of sodium choride)   This patient was seen by Vincent Gros FNP Collaboration with Dr Lyndon Code as a part of collaborative care agreement  Orders Placed This Encounter  Procedures  .  Microscopic Examination  . UA/M w/rflx Culture, Routine    Meds ordered this encounter  Medications  . amLODipine (NORVASC) 5 MG tablet    Sig: Take 1 tablet (5 mg total) by mouth daily.    Dispense:  90 tablet    Refill:  3    Order Specific Question:   Supervising Provider    Answer:   Lyndon Code [[id number]]  . benazepril (LOTENSIN) 5 MG tablet    Sig: Take 1 tablet (5 mg total) by mouth daily.    Dispense:  90 tablet    Refill:  3    Order Specific Question:   Supervising Provider    Answer:   Lyndon Code [[id number]]  . clopidogrel (PLAVIX) 75 MG tablet    Sig: Take 1 tablet (75 mg total) by mouth daily.    Dispense:  90 tablet    Refill:  3    Order Specific Question:   Supervising Provider    Answer:   Lavera Guise [[id number]]  . lovastatin (MEVACOR) 40 MG tablet    Sig: Take 1 tablet (40 mg total) by mouth at bedtime.    Dispense:  90 tablet    Refill:  3    Order Specific Question:   Supervising Provider    Answer:   Lavera Guise [[id number]]    Time spent: [place], MD  Internal Medicine

## 2019-02-12 LAB — UA/M W/RFLX CULTURE, ROUTINE
Bilirubin, UA: NEGATIVE
Glucose, UA: NEGATIVE
Ketones, UA: NEGATIVE
Leukocytes,UA: NEGATIVE
Nitrite, UA: NEGATIVE
Protein,UA: NEGATIVE
RBC, UA: NEGATIVE
Specific Gravity, UA: 1.005 (ref 1.005–1.030)
Urobilinogen, Ur: 0.2 mg/dL (ref 0.2–1.0)
pH, UA: 7.5 (ref 5.0–7.5)

## 2019-02-12 LAB — MICROSCOPIC EXAMINATION
Bacteria, UA: NONE SEEN
Casts: NONE SEEN /lpf

## 2019-02-13 ENCOUNTER — Telehealth: Payer: Self-pay

## 2019-02-13 NOTE — Telephone Encounter (Signed)
Faxed cologuard 

## 2019-02-15 ENCOUNTER — Other Ambulatory Visit: Payer: Self-pay

## 2019-04-24 ENCOUNTER — Encounter: Payer: Self-pay | Admitting: Nurse Practitioner

## 2019-04-24 LAB — COLOGUARD: Cologuard: NEGATIVE

## 2019-08-13 ENCOUNTER — Telehealth: Payer: Self-pay

## 2019-08-13 NOTE — Telephone Encounter (Signed)
CONFIRMED AS VIRTUAL VISIT ON 08-15-19.

## 2019-08-15 ENCOUNTER — Ambulatory Visit: Payer: 59 | Admitting: Adult Health

## 2019-08-15 ENCOUNTER — Encounter: Payer: Self-pay | Admitting: Nurse Practitioner

## 2019-08-15 ENCOUNTER — Other Ambulatory Visit: Payer: Self-pay

## 2019-08-15 VITALS — Ht 64.0 in | Wt 191.0 lb

## 2019-08-15 DIAGNOSIS — I10 Essential (primary) hypertension: Secondary | ICD-10-CM

## 2019-08-15 DIAGNOSIS — Z8673 Personal history of transient ischemic attack (TIA), and cerebral infarction without residual deficits: Secondary | ICD-10-CM | POA: Diagnosis not present

## 2019-08-15 DIAGNOSIS — E782 Mixed hyperlipidemia: Secondary | ICD-10-CM

## 2019-08-15 NOTE — Progress Notes (Signed)
[facility] [place], [place] [address]  Internal MEDICINE  Telephone Visit  Patient Name: Valerie Thornton  [id number]  [id number]  Date of Service: 08/15/2019  I connected with the patient at 507 by telephone and verified the patients identity using two identifiers.   I discussed the limitations, risks, security and privacy concerns of performing an evaluation and management service by telephone and the availability of in person appointments. I also discussed with the patient that there Dovel be a patient responsible charge related to the service.  The patient expressed understanding and agrees to proceed.    Chief Complaint  Patient presents with  . Telephone Screen  . Telephone Assessment  . Hypertension    HPI  Pt seen via telephone.  She reports she has been at her baseline since her last visit. History of HLD, and HTN.   Denies Chest pain, Shortness of breath, palpitations, headache, or blurred vision. She has not checked her bp recently, but when it is checked, it is not elevated.  She is currently taking amlodipine 5mg  nightly, and  benzapril 5mg  daily. She also takes Plavix for previous TIA.    Current Medication: Outpatient Encounter Medications as of 08/15/2019  Medication Sig  . amLODipine (NORVASC) 5 MG tablet Take 1 tablet (5 mg total) by mouth daily.  . benazepril (LOTENSIN) 5 MG tablet Take 1 tablet (5 mg total) by mouth daily.  . clopidogrel (PLAVIX) 75 MG tablet Take 1 tablet (75 mg total) by mouth daily.  Marland Kitchen lovastatin (MEVACOR) 40 MG tablet Take 1 tablet (40 mg total) by mouth at bedtime.   No facility-administered encounter medications on file as of 08/15/2019.    Surgical History: Past Surgical History:  Procedure Laterality Date  . TONSILLECTOMY AND ADENOIDECTOMY     pt was 64 years old    Medical History: Past Medical History:  Diagnosis Date  . Erythrocytosis   . Hypertension   . Migraines    per pt it was hormonal and food  caused, hardly anymore she has one  . TIA (transient ischemic attack) 2015   affected right side but completely resolved    Family History: Family History  Problem Relation Age of Onset  . Breast cancer Maternal Aunt 70  . Hypertension Mother   . Hypertension Sister     Social History   Socioeconomic History  . Marital status: Married    Spouse name: Not on file  . Number of children: Not on file  . Years of education: Not on file  . Highest education level: Not on file  Occupational History  . Not on file  Tobacco Use  . Smoking status: Never Smoker  . Smokeless tobacco: Never Used  Substance and Sexual Activity  . Alcohol use: Yes    Comment: glass once a month  . Drug use: No  . Sexual activity: Not on file  Other Topics Concern  . Not on file  Social History Narrative  . Not on file   Social Determinants of Health   Financial Resource Strain:   . Difficulty of Paying Living Expenses: Not on file  Food Insecurity:   . Worried About Charity fundraiser in the Last Year: Not on file  . Ran Out of Food in the Last Year: Not on file  Transportation Needs:   . Lack of Transportation (Medical): Not on file  . Lack of Transportation (Non-Medical): Not on file  Physical Activity:   . Days of  Exercise per Week: Not on file  . Minutes of Exercise per Session: Not on file  Stress:   . Feeling of Stress : Not on file  Social Connections:   . Frequency of Communication with Friends and Family: Not on file  . Frequency of Social Gatherings with Friends and Family: Not on file  . Attends Religious Services: Not on file  . Active Member of Clubs or Organizations: Not on file  . Attends Banker Meetings: Not on file  . Marital Status: Not on file  Intimate Partner Violence:   . Fear of Current or Ex-Partner: Not on file  . Emotionally Abused: Not on file  . Physically Abused: Not on file  . Sexually Abused: Not on file      Review of Systems   Constitutional: Negative for chills, fatigue and unexpected weight change.  HENT: Negative for congestion, rhinorrhea, sneezing and sore throat.   Eyes: Negative for photophobia, pain and redness.  Respiratory: Negative for cough, chest tightness and shortness of breath.   Cardiovascular: Negative for chest pain and palpitations.  Gastrointestinal: Negative for abdominal pain, constipation, diarrhea, nausea and vomiting.  Endocrine: Negative.   Genitourinary: Negative for dysuria and frequency.  Musculoskeletal: Negative for arthralgias, back pain, joint swelling and neck pain.  Skin: Negative for rash.  Allergic/Immunologic: Negative.   Neurological: Negative for tremors and numbness.  Hematological: Negative for adenopathy. Does not bruise/bleed easily.  Psychiatric/Behavioral: Negative for behavioral problems and sleep disturbance. The patient is not nervous/anxious.     Vital Signs: Ht 5\' 4"  (1.626 m)   Wt 191 lb (86.6 kg)   BMI 32.79 kg/m    Observation/Objective:  Well sounding, NAD noted.    Assessment/Plan: 1. Essential hypertension Stable, continue present management.   2. Mixed hyperlipidemia Stable, continue lovastatin at current dose.   3. History of TIAs Continue plavix at this time.     General Counseling: macon lesesne understanding of the findings of today's phone visit and agrees with plan of treatment. I have discussed any further diagnostic evaluation that Raynor be needed or ordered today. We also reviewed her medications today. she has been encouraged to call the office with any questions or concerns that should arise related to todays visit.    No orders of the defined types were placed in this encounter.   No orders of the defined types were placed in this encounter.   Time spent: 15 Minutes    Kathyrn Lass AGNP-C Internal medicine

## 2019-11-14 ENCOUNTER — Ambulatory Visit: Payer: 59

## 2019-11-20 ENCOUNTER — Encounter: Payer: Self-pay | Admitting: Nurse Practitioner

## 2019-11-23 ENCOUNTER — Ambulatory Visit: Payer: PRIVATE HEALTH INSURANCE

## 2019-11-25 NOTE — Telephone Encounter (Signed)
Her thyroid levels have never been issue in the past. I would like to retest them first. I can see her via webcam or whatever first, or we can send her for labs and I can get new labs ordered with that visit.

## 2019-12-17 ENCOUNTER — Telehealth: Payer: Self-pay

## 2019-12-17 NOTE — Telephone Encounter (Signed)
Confirmed and screened for 12-19-19 ov. 

## 2019-12-19 ENCOUNTER — Ambulatory Visit: Payer: 59 | Admitting: Nurse Practitioner

## 2019-12-19 ENCOUNTER — Encounter: Payer: Self-pay | Admitting: Nurse Practitioner

## 2019-12-19 ENCOUNTER — Other Ambulatory Visit: Payer: Self-pay

## 2019-12-19 VITALS — BP 127/77 | HR 77 | Temp 97.3°F | Resp 16 | Ht 64.0 in | Wt 190.6 lb

## 2019-12-19 DIAGNOSIS — E010 Iodine-deficiency related diffuse (endemic) goiter: Secondary | ICD-10-CM | POA: Diagnosis not present

## 2019-12-19 DIAGNOSIS — E782 Mixed hyperlipidemia: Secondary | ICD-10-CM | POA: Diagnosis not present

## 2019-12-19 DIAGNOSIS — I10 Essential (primary) hypertension: Secondary | ICD-10-CM | POA: Diagnosis not present

## 2019-12-19 NOTE — Progress Notes (Signed)
[facility] [place], [place] [address]  Internal MEDICINE  Office Visit Note  Patient Name: Valerie Thornton  [id number]  [id number]  Date of Service: 12/19/2019  Chief Complaint  Patient presents with  . Follow-up    review labs     The patient is here for follow up of labs. She did have initial consultation with bariatric clinic. She had labs done. TSH was low and T4 was on low side of normal. Reviewing labs for the past year show a TSH which has trended down and free T4 which has also trended in downward direction. She states that she has been feeling tired. Sometimes, she feels cold. She is concerned because she is really having a difficult time losing weight. Bariatric clinic will not start her on any medication for weight management because she does have history of mini stroke, though thorough review of medical records shows no evidence of previous clot or infarct.  Other labs which were done at bariatric clinic were good. LDL was 105 with other components of lipid panel normal. Blood pressure is well controlled on current medication.      Current Medication: Outpatient Encounter Medications as of 12/19/2019  Medication Sig  . amLODipine (NORVASC) 5 MG tablet Take 1 tablet (5 mg total) by mouth daily.  . benazepril (LOTENSIN) 5 MG tablet Take 1 tablet (5 mg total) by mouth daily.  . clopidogrel (PLAVIX) 75 MG tablet Take 1 tablet (75 mg total) by mouth daily.  Marland Kitchen lovastatin (MEVACOR) 40 MG tablet Take 1 tablet (40 mg total) by mouth at bedtime.   No facility-administered encounter medications on file as of 12/19/2019.    Surgical History: Past Surgical History:  Procedure Laterality Date  . TONSILLECTOMY AND ADENOIDECTOMY     pt was 64 years old    Medical History: Past Medical History:  Diagnosis Date  . Erythrocytosis   . Hypertension   . 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 TIA (transient ischemic  attack) 2015   affected right side but completely resolved    Family History: Family History  Problem Relation Age of Onset  . Breast cancer Maternal Aunt 70  . Hypertension Mother   . Hypertension Sister     Social History   Socioeconomic History  . Marital status: Married    Spouse name: Not on file  . Number of children: Not on file  . Years of education: Not on file  . Highest education level: Not on file  Occupational History  . Not on file  Tobacco Use  . Smoking status: Never Smoker  . Smokeless tobacco: Never Used  Substance and Sexual Activity  . Alcohol use: Yes    Comment: few times a month   . Drug use: No  . Sexual activity: Not on file  Other Topics Concern  . Not on file  Social History Narrative  . Not on file   Social Determinants of Health   Financial Resource Strain:   . Difficulty of Paying Living Expenses:   Food Insecurity:   . Worried About Charity fundraiser in the Last Year:   . Arboriculturist in the Last Year:   Transportation Needs:   . Film/video editor (Medical):   Marland Kitchen Lack of Transportation (Non-Medical):   Physical Activity:   . Days of Exercise per Week:   . Minutes of Exercise per Session:  Stress:   . Feeling of Stress :   Social Connections:   . Frequency of Communication with Friends and Family:   . Frequency of Social Gatherings with Friends and Family:   . Attends Religious Services:   . Active Member of Clubs or Organizations:   . Attends Banker Meetings:   Marland Kitchen Marital Status:   Intimate Partner Violence:   . Fear of Current or Ex-Partner:   . Emotionally Abused:   Marland Kitchen Physically Abused:   . Sexually Abused:       Review of Systems  Constitutional: Negative for activity change, chills, fatigue and unexpected weight change.  HENT: Negative for congestion, postnasal drip, rhinorrhea, sneezing and sore throat.   Respiratory: Negative for cough, chest tightness, shortness of breath and wheezing.    Cardiovascular: Negative for chest pain and palpitations.  Gastrointestinal: Negative for abdominal pain, constipation, diarrhea, nausea and vomiting.  Endocrine: Negative for cold intolerance, heat intolerance, polydipsia and polyuria.       Abnormal thyroid panel at bariatric clinic.   Musculoskeletal: Negative for arthralgias, back pain, joint swelling and neck pain.  Skin: Negative for rash.  Allergic/Immunologic: Positive for environmental allergies.  Neurological: Negative for dizziness, tremors, numbness and headaches.  Hematological: Negative for adenopathy. Does not bruise/bleed easily.  Psychiatric/Behavioral: Negative for behavioral problems (Depression), sleep disturbance and suicidal ideas. The patient is not nervous/anxious.     Today's Vitals   12/19/19 0848  BP: 127/77  Pulse: 77  Resp: 16  Temp: (!) 97.3 F (36.3 C)  SpO2: 98%  Weight: 190 lb 9.6 oz (86.5 kg)  Height: 5\' 4"  (1.626 m)   Body mass index is 32.72 kg/m.  Physical Exam Vitals and nursing note reviewed.  Constitutional:      General: She is not in acute distress.    Appearance: Normal appearance. She is well-developed. She is not diaphoretic.  HENT:     Head: Normocephalic and atraumatic.     Mouth/Throat:     Pharynx: No oropharyngeal exudate.  Eyes:     Pupils: Pupils are equal, round, and reactive to light.  Neck:     Thyroid: Thyromegaly present.     Vascular: No carotid bruit or JVD.     Trachea: No tracheal deviation.  Cardiovascular:     Rate and Rhythm: Normal rate and regular rhythm.     Heart sounds: Normal heart sounds. No murmur. No friction rub. No gallop.   Pulmonary:     Effort: Pulmonary effort is normal. No respiratory distress.     Breath sounds: Normal breath sounds. No wheezing or rales.  Chest:     Chest wall: No tenderness.  Abdominal:     General: Bowel sounds are normal.     Palpations: Abdomen is soft.  Musculoskeletal:        General: Normal range of motion.      Cervical back: Normal range of motion and neck supple.  Lymphadenopathy:     Cervical: No cervical adenopathy.  Skin:    General: Skin is warm and dry.  Neurological:     General: No focal deficit present.     Mental Status: She is alert and oriented to person, place, and time.     Cranial Nerves: No cranial nerve deficit.  Psychiatric:        Mood and Affect: Mood normal.        Behavior: Behavior normal.        Thought Content: Thought content normal.  Judgment: Judgment normal.    Assessment/Plan: 1. Essential hypertension Good blood pressure control continue medication as prescribed   2. Mixed hyperlipidemia Reviewed lipid panel. LDL 105, otherwise normal panel. Continue lovastatin as prescribed   3. Thyromegaly Recheck thyroid panel. Add thyroid auto-antibodies. Will get ultrasound of thyroid fur further evaluation.  - US Soft Tissue Head/Neck (NON-THYROID); Future  General Counseling: Valerie Thornton understanding of the findings of todays visit and agrees with plan of treatment. I have discussed any further diagnostic evaluation that Taddei be needed or ordered today. We also reviewed her medications today. she has been encouraged to call the office with any questions or concerns that should arise related to todays visit.    Orders Placed This Encounter  Procedures  . US Soft Tissue Head/Neck (NON-THYROID)   This patient was seen by Vincent Gros FNP Collaboration with Dr Lyndon Code as a part of collaborative care agreement  Total time spent: 30 Minutes   Time spent includes review of chart, medications, test results, and follow up plan with the patient.      Dr Lyndon Code Internal medicine

## 2019-12-30 ENCOUNTER — Other Ambulatory Visit: Payer: Self-pay | Admitting: Nurse Practitioner

## 2019-12-31 LAB — T4, FREE: Free T4: 0.92 ng/dL (ref 0.82–1.77)

## 2019-12-31 LAB — THYROID PEROXIDASE ANTIBODY: Thyroperoxidase Ab SerPl-aCnc: <9 IU/mL (ref 0–34)

## 2019-12-31 LAB — T3: T3, Total: 102 ng/dL (ref 71–180)

## 2019-12-31 LAB — TSH: TSH: 0.352 u[IU]/mL — ABNORMAL LOW (ref 0.450–4.500)

## 2019-12-31 LAB — THYROGLOBULIN ANTIBODY: Thyroglobulin Antibody: <1 IU/mL (ref 0.0–0.9)

## 2020-01-01 NOTE — Progress Notes (Signed)
Review with patient at visit 01/14/2020

## 2020-01-02 ENCOUNTER — Telehealth: Payer: Self-pay

## 2020-01-02 NOTE — Telephone Encounter (Signed)
Confirmed appointment on 01/03/2020 and screened for covid. klh 

## 2020-01-03 ENCOUNTER — Ambulatory Visit (INDEPENDENT_AMBULATORY_CARE_PROVIDER_SITE_OTHER): Payer: 59

## 2020-01-03 ENCOUNTER — Other Ambulatory Visit: Payer: Self-pay

## 2020-01-03 DIAGNOSIS — E010 Iodine-deficiency related diffuse (endemic) goiter: Secondary | ICD-10-CM | POA: Diagnosis not present

## 2020-01-09 ENCOUNTER — Telehealth: Payer: Self-pay

## 2020-01-09 NOTE — Telephone Encounter (Signed)
Confirmed and screened for 01-14-20 ov. 

## 2020-01-09 NOTE — Telephone Encounter (Signed)
Lmom to confirm for 01-14-20 ov. °

## 2020-01-14 ENCOUNTER — Ambulatory Visit: Payer: 59 | Admitting: Nurse Practitioner

## 2020-01-14 ENCOUNTER — Encounter: Payer: Self-pay | Admitting: Nurse Practitioner

## 2020-01-14 ENCOUNTER — Other Ambulatory Visit: Payer: Self-pay

## 2020-01-14 VITALS — BP 121/74 | HR 66 | Temp 97.3°F | Resp 16 | Ht 64.0 in | Wt 192.0 lb

## 2020-01-14 DIAGNOSIS — E782 Mixed hyperlipidemia: Secondary | ICD-10-CM

## 2020-01-14 DIAGNOSIS — E042 Nontoxic multinodular goiter: Secondary | ICD-10-CM

## 2020-01-14 DIAGNOSIS — I10 Essential (primary) hypertension: Secondary | ICD-10-CM

## 2020-01-14 DIAGNOSIS — E010 Iodine-deficiency related diffuse (endemic) goiter: Secondary | ICD-10-CM | POA: Diagnosis not present

## 2020-01-14 NOTE — Progress Notes (Signed)
[facility] [place], [place] [address]  Internal MEDICINE  Office Visit Note  Patient Name: Valerie Thornton  [id number]  [id number]  Date of Service: 01/18/2020  Chief Complaint  Patient presents with  . Follow-up    review ultrasound    The patient is here for routine follow up. The patient has abnormal TSH at 0.352 with normal T3 and free T4. Negative thyroid antibodies. Thyroid ultrasound indicates 2.6cm nodules, bilaterally. There are two other nodules on left side of the thyroid measuring over 1.5cm in diameter. She has thyromegaly. She has no complaint of tachycardia. Blood pressure is well controlled. New new problems or concerns at this time.       Current Medication: Outpatient Encounter Medications as of 01/14/2020  Medication Sig  . amLODipine (NORVASC) 5 MG tablet Take 1 tablet (5 mg total) by mouth daily.  . benazepril (LOTENSIN) 5 MG tablet Take 1 tablet (5 mg total) by mouth daily.  . clopidogrel (PLAVIX) 75 MG tablet Take 1 tablet (75 mg total) by mouth daily.  Marland Kitchen lovastatin (MEVACOR) 40 MG tablet Take 1 tablet (40 mg total) by mouth at bedtime.   No facility-administered encounter medications on file as of 01/14/2020.    Surgical History: Past Surgical History:  Procedure Laterality Date  . TONSILLECTOMY AND ADENOIDECTOMY     pt was 64 years old    Medical History: Past Medical History:  Diagnosis Date  . Erythrocytosis   . Hypertension   . 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 TIA (transient ischemic attack) 2015   affected right side but completely resolved    Family History: Family History  Problem Relation Age of Onset  . Breast cancer Maternal Aunt 70  . Hypertension Mother   . Hypertension Sister     Social History   Socioeconomic History  . Marital status: Married    Spouse name: Not on file  . Number of children: Not on file  . Years of education: Not on file  .  Highest education level: Not on file  Occupational History  . Not on file  Tobacco Use  . Smoking status: Never Smoker  . Smokeless tobacco: Never Used  Substance and Sexual Activity  . Alcohol use: Yes    Comment: few times a month   . Drug use: No  . Sexual activity: Not on file  Other Topics Concern  . Not on file  Social History Narrative  . Not on file   Social Determinants of Health   Financial Resource Strain:   . Difficulty of Paying Living Expenses:   Food Insecurity:   . Worried About Charity fundraiser in the Last Year:   . Arboriculturist in the Last Year:   Transportation Needs:   . Film/video editor (Medical):   Marland Kitchen Lack of Transportation (Non-Medical):   Physical Activity:   . Days of Exercise per Week:   . Minutes of Exercise per Session:   Stress:   . Feeling of Stress :   Social Connections:   . Frequency of Communication with Friends and Family:   . Frequency of Social Gatherings with Friends and Family:   . Attends Religious Services:   . Active Member of Clubs or Organizations:   . Attends Archivist Meetings:   Marland Kitchen Marital Status:   Intimate Partner Violence:   . Fear of Current or Ex-Partner:   .  Emotionally Abused:   Marland Kitchen Physically Abused:   . Sexually Abused:       Review of Systems  Constitutional: Negative for activity change, chills, fatigue and unexpected weight change.  HENT: Negative for congestion, postnasal drip, rhinorrhea, sneezing and sore throat.   Respiratory: Negative for cough, chest tightness, shortness of breath and wheezing.   Cardiovascular: Negative for chest pain and palpitations.  Gastrointestinal: Negative for abdominal pain, constipation, diarrhea, nausea and vomiting.  Endocrine: Negative for cold intolerance, heat intolerance, polydipsia and polyuria.  Musculoskeletal: Negative for arthralgias, back pain, joint swelling and neck pain.  Skin: Negative for rash.  Allergic/Immunologic: Positive for  environmental allergies.  Neurological: Negative for dizziness, tremors, numbness and headaches.  Hematological: Negative for adenopathy. Does not bruise/bleed easily.  Psychiatric/Behavioral: Negative for behavioral problems (Depression), sleep disturbance and suicidal ideas. The patient is not nervous/anxious.     Today's Vitals   01/14/20 1013  BP: 121/74  Pulse: 66  Resp: 16  Temp: (!) 97.3 F (36.3 C)  SpO2: 97%  Weight: 192 lb (87.1 kg)  Height: 5\' 4"  (1.626 m)   Body mass index is 32.96 kg/m.  Physical Exam Vitals and nursing note reviewed.  Constitutional:      General: She is not in acute distress.    Appearance: Normal appearance. She is well-developed. She is not diaphoretic.  HENT:     Head: Normocephalic and atraumatic.     Mouth/Throat:     Pharynx: No oropharyngeal exudate.  Eyes:     Pupils: Pupils are equal, round, and reactive to light.  Neck:     Thyroid: Thyromegaly present.     Vascular: No carotid bruit or JVD.     Trachea: No tracheal deviation.  Cardiovascular:     Rate and Rhythm: Normal rate and regular rhythm.     Heart sounds: Normal heart sounds. No murmur. No friction rub. No gallop.   Pulmonary:     Effort: Pulmonary effort is normal. No respiratory distress.     Breath sounds: Normal breath sounds. No wheezing or rales.  Chest:     Chest wall: No tenderness.  Abdominal:     Palpations: Abdomen is soft.  Musculoskeletal:        General: Normal range of motion.     Cervical back: Normal range of motion and neck supple.  Lymphadenopathy:     Cervical: No cervical adenopathy.  Skin:    General: Skin is warm and dry.  Neurological:     General: No focal deficit present.     Mental Status: She is alert and oriented to person, place, and time.     Cranial Nerves: No cranial nerve deficit.  Psychiatric:        Mood and Affect: Mood normal.        Behavior: Behavior normal.        Thought Content: Thought content normal.         Judgment: Judgment normal.   Assessment/Plan: 1. Thyromegaly Reviewed results of thyroid ultrasound with the patient. Thyroid ultrasound indicates 2.6cm nodules, bilaterally. There are two other nodules on left side of the thyroid measuring over 1.5cm in diameter. She has thyromegaly. Refer to endocrinology for further evaluation.  - Ambulatory referral to Endocrinology  2. Multinodular goiter Reviewed results of thyroid ultrasound with the patient. Thyroid ultrasound indicates 2.6cm nodules, bilaterally. There are two other nodules on left side of the thyroid measuring over 1.5cm in diameter. She has thyromegaly. Refer to endocrinology for further evaluation. -  Ambulatory referral to Endocrinology  3. Essential hypertension Blood pressure stable. Continue bp medication as prescribed   4. Mixed hyperlipidemia Stable. Continue lovastatin as prescribed   General Counseling: nakota ackert understanding of the findings of todays visit and agrees with plan of treatment. I have discussed any further diagnostic evaluation that Demos be needed or ordered today. We also reviewed her medications today. she has been encouraged to call the office with any questions or concerns that should arise related to todays visit.  This patient was seen by Vincent Gros FNP Collaboration with Dr Lyndon Code as a part of collaborative care agreement  Orders Placed This Encounter  Procedures  . Ambulatory referral to Endocrinology      Total time spent: 30 Minutes   Time spent includes review of chart, medications, test results, and follow up plan with the patient.      Dr Lyndon Code Internal medicine

## 2020-01-18 DIAGNOSIS — E042 Nontoxic multinodular goiter: Secondary | ICD-10-CM | POA: Insufficient documentation

## 2020-02-07 ENCOUNTER — Other Ambulatory Visit: Payer: Self-pay | Admitting: Surgery

## 2020-02-07 DIAGNOSIS — E041 Nontoxic single thyroid nodule: Secondary | ICD-10-CM

## 2020-02-17 ENCOUNTER — Ambulatory Visit: Payer: 59 | Admitting: Nurse Practitioner

## 2020-02-24 ENCOUNTER — Encounter
Admission: RE | Admit: 2020-02-24 | Discharge: 2020-02-24 | Disposition: A | Discharge status: Home / Self Care | Payer: 59 | Source: Ambulatory Visit | Attending: Surgery | Admitting: Surgery

## 2020-02-24 ENCOUNTER — Other Ambulatory Visit: Payer: Self-pay

## 2020-02-24 DIAGNOSIS — E041 Nontoxic single thyroid nodule: Secondary | ICD-10-CM | POA: Insufficient documentation

## 2020-02-24 MED ORDER — SODIUM IODIDE I-123 7.4 MBQ CAPS
429.0700 | ORAL_CAPSULE | Freq: Once | ORAL | Status: AC
  Administered 2020-02-24: 429.07 via ORAL

## 2020-02-25 ENCOUNTER — Encounter
Admission: RE | Admit: 2020-02-25 | Discharge: 2020-02-25 | Disposition: A | Discharge status: Home / Self Care | Payer: 59 | Source: Ambulatory Visit | Attending: Surgery | Admitting: Surgery

## 2020-02-26 ENCOUNTER — Telehealth: Payer: Self-pay

## 2020-02-27 NOTE — Telephone Encounter (Signed)
Patient cancelled appointment on 0706/2021. klh

## 2020-03-03 ENCOUNTER — Ambulatory Visit: Payer: 59 | Admitting: Nurse Practitioner

## 2020-03-19 ENCOUNTER — Other Ambulatory Visit: Payer: Self-pay

## 2020-03-19 DIAGNOSIS — Z8673 Personal history of transient ischemic attack (TIA), and cerebral infarction without residual deficits: Secondary | ICD-10-CM

## 2020-03-19 DIAGNOSIS — I10 Essential (primary) hypertension: Secondary | ICD-10-CM

## 2020-03-19 DIAGNOSIS — E782 Mixed hyperlipidemia: Secondary | ICD-10-CM

## 2020-03-19 MED ORDER — AMLODIPINE BESYLATE 5 MG PO TABS: 5.0000 mg | ORAL_TABLET | Freq: Every day | ORAL | 1 refills | Status: DC

## 2020-03-19 MED ORDER — BENAZEPRIL HCL 5 MG PO TABS: 5.0000 mg | ORAL_TABLET | Freq: Every day | ORAL | 1 refills | Status: DC

## 2020-03-19 MED ORDER — CLOPIDOGREL BISULFATE 75 MG PO TABS: 75.0000 mg | ORAL_TABLET | Freq: Every day | ORAL | 1 refills | Status: DC

## 2020-03-19 MED ORDER — LOVASTATIN 40 MG PO TABS: 40.0000 mg | ORAL_TABLET | Freq: Every day | ORAL | 1 refills | Status: DC

## 2020-04-15 ENCOUNTER — Other Ambulatory Visit: Payer: Self-pay | Admitting: Nurse Practitioner

## 2020-04-15 DIAGNOSIS — Z1231 Encounter for screening mammogram for malignant neoplasm of breast: Secondary | ICD-10-CM

## 2020-04-30 ENCOUNTER — Ambulatory Visit
Admission: RE | Admit: 2020-04-30 | Discharge: 2020-04-30 | Disposition: A | Discharge status: Home / Self Care | Payer: 59 | Source: Ambulatory Visit | Attending: Nurse Practitioner | Admitting: Nurse Practitioner

## 2020-04-30 ENCOUNTER — Encounter: Payer: Self-pay | Admitting: Nurse Practitioner

## 2020-04-30 ENCOUNTER — Other Ambulatory Visit: Payer: Self-pay | Admitting: Nurse Practitioner

## 2020-04-30 DIAGNOSIS — Z1231 Encounter for screening mammogram for malignant neoplasm of breast: Secondary | ICD-10-CM | POA: Diagnosis not present

## 2020-04-30 DIAGNOSIS — L209 Atopic dermatitis, unspecified: Secondary | ICD-10-CM

## 2020-04-30 MED ORDER — TRIAMCINOLONE ACETONIDE 0.025 % EX CREA: 1.0000 "application " | TOPICAL_CREAM | Freq: Two times a day (BID) | CUTANEOUS | 2 refills | Status: DC

## 2020-06-22 ENCOUNTER — Ambulatory Visit: Payer: 59 | Admitting: Nurse Practitioner

## 2020-06-22 ENCOUNTER — Encounter: Payer: Self-pay | Admitting: Nurse Practitioner

## 2020-06-22 ENCOUNTER — Other Ambulatory Visit: Payer: Self-pay

## 2020-06-22 VITALS — BP 132/78 | HR 82 | Temp 98.0°F | Resp 16 | Ht 64.0 in | Wt 194.0 lb

## 2020-06-22 DIAGNOSIS — Z23 Encounter for immunization: Secondary | ICD-10-CM | POA: Diagnosis not present

## 2020-06-22 DIAGNOSIS — B372 Candidiasis of skin and nail: Secondary | ICD-10-CM | POA: Diagnosis not present

## 2020-06-22 MED ORDER — FLUCONAZOLE 150 MG PO TABS: ORAL_TABLET | ORAL | 0 refills | Status: DC

## 2020-06-22 MED ORDER — CLOTRIMAZOLE-BETAMETHASONE 1-0.05 % EX CREA: 1.0000 "application " | TOPICAL_CREAM | Freq: Two times a day (BID) | CUTANEOUS | 2 refills | Status: DC

## 2020-06-22 NOTE — Progress Notes (Signed)
[facility] [address] [place], [place] [address]  Internal MEDICINE  Office Visit Note  Patient Name: Valerie Thornton  [id number]  [id number]  Date of Service: 07/11/2020   Pt is here for a sick visit.  Chief Complaint  Patient presents with  . Acute Visit  . Rash    on legs  . controlled substance form    reviewed with PT     The patient is here for acute visit. She has been having red, inflamed, itchy rash in groin area of both legs. Has been present for a few weeks. She has been using previously prescribed triamcinolone cream twice daily and this does calm the rash for a short period of time, but symptoms come right back.  The patient would like to get her flu shot while she is here today.        Current Medication:  Outpatient Encounter Medications as of 06/22/2020  Medication Sig  . amLODipine (NORVASC) 5 MG tablet Take 1 tablet (5 mg total) by mouth daily.  . benazepril (LOTENSIN) 5 MG tablet Take 1 tablet (5 mg total) by mouth daily.  . clopidogrel (PLAVIX) 75 MG tablet Take 1 tablet (75 mg total) by mouth daily.  Marland Kitchen lovastatin (MEVACOR) 40 MG tablet Take 1 tablet (40 mg total) by mouth at bedtime.  . triamcinolone (KENALOG) 0.025 % cream Apply 1 application topically 2 (two) times daily.  . clotrimazole-betamethasone (LOTRISONE) cream Apply 1 application topically 2 (two) times daily.  . fluconazole (DIFLUCAN) 150 MG tablet Take 1 tablet po QOD for 5 doses   No facility-administered encounter medications on file as of 06/22/2020.      Medical History: Past Medical History:  Diagnosis Date  . Erythrocytosis   . Hypertension   . 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 TIA (transient ischemic attack) 2015   affected right side but completely resolved     Today's Vitals   06/22/20 0931  BP: 132/78  Pulse: 82  Resp: 16  Temp: 98 F (36.7 C)  SpO2: 96%  Weight: 194 lb (88 kg)  Height: 5\' 4"   (1.626 m)   Body mass index is 33.3 kg/m.  Review of Systems  Constitutional: Negative for activity change, chills, fatigue and unexpected weight change.  HENT: Negative for congestion, postnasal drip, rhinorrhea, sneezing and sore throat.   Respiratory: Negative for cough, chest tightness and shortness of breath.   Cardiovascular: Negative for chest pain and palpitations.  Gastrointestinal: Negative for abdominal pain, constipation, diarrhea, nausea and vomiting.  Endocrine: Negative for cold intolerance, heat intolerance, polydipsia and polyuria.  Musculoskeletal: Negative for arthralgias, back pain, joint swelling and neck pain.  Skin: Positive for rash.       Itchy and red rash in the groin area.triamcinolone cream not really helping.   Allergic/Immunologic: Negative for environmental allergies.  Neurological: Negative for dizziness, tremors, numbness and headaches.  Hematological: Negative for adenopathy. Does not bruise/bleed easily.  Psychiatric/Behavioral: Negative for behavioral problems (Depression), sleep disturbance and suicidal ideas. The patient is not nervous/anxious.     Physical Exam Vitals and nursing note reviewed.  Constitutional:      General: She is not in acute distress.    Appearance: Normal appearance. She is well-developed. She is not diaphoretic.  HENT:     Head: Normocephalic and atraumatic.     Nose: Nose normal.     Mouth/Throat:     Pharynx:  No oropharyngeal exudate.  Eyes:     Pupils: Pupils are equal, round, and reactive to light.  Neck:     Thyroid: No thyromegaly.     Vascular: No JVD.     Trachea: No tracheal deviation.  Cardiovascular:     Rate and Rhythm: Normal rate and regular rhythm.     Heart sounds: Normal heart sounds. No murmur heard.  No friction rub. No gallop.   Pulmonary:     Effort: Pulmonary effort is normal. No respiratory distress.     Breath sounds: Normal breath sounds. No wheezing or rales.  Chest:     Chest wall: No  tenderness.  Abdominal:     Palpations: Abdomen is soft.  Musculoskeletal:        General: Normal range of motion.     Cervical back: Normal range of motion and neck supple.  Lymphadenopathy:     Cervical: No cervical adenopathy.  Skin:    General: Skin is warm and dry.     Findings: Rash present.     Comments: There is red, irritated rash in both groin areas. Skin intact with no drainage. Rash is warm to palpate.   Neurological:     Mental Status: She is alert and oriented to person, place, and time.     Cranial Nerves: No cranial nerve deficit.  Psychiatric:        Mood and Affect: Mood normal.        Behavior: Behavior normal.        Thought Content: Thought content normal.        Judgment: Judgment normal.    Assessment/Plan: 1. Cutaneous candidiasis Start diflucan 150mg  every other day for five doses. Apply lotrisone cream - apply to effected areas twice daily. Continue to use for two to three days after clearance of rash to ensure clearance.  - clotrimazole-betamethasone (LOTRISONE) cream; Apply 1 application topically 2 (two) times daily.  Dispense: 45 g; Refill: 2 - fluconazole (DIFLUCAN) 150 MG tablet; Take 1 tablet po QOD for 5 doses  Dispense: 5 tablet; Refill: 0  2. Needs flu shot Flu vaccine administered today.  - Flu Vaccine MDCK QUAD PF  General Counseling: Valerie Thornton verbalizes understanding of the findings of todays visit and agrees with plan of treatment. I have discussed any further diagnostic evaluation that Stege be needed or ordered today. We also reviewed her medications today. she has been encouraged to call the office with any questions or concerns that should arise related to todays visit.    Counseling:  This patient was seen by Clydie Braun FNP Collaboration with Dr Vincent Gros as a part of collaborative care agreement  Orders Placed This Encounter  Procedures  . Flu Vaccine MDCK QUAD PF    Meds ordered this encounter  Medications  .  clotrimazole-betamethasone (LOTRISONE) cream    Sig: Apply 1 application topically 2 (two) times daily.    Dispense:  45 g    Refill:  2    Order Specific Question:   Supervising Provider    Answer:   Lyndon Code [[id number]]  . fluconazole (DIFLUCAN) 150 MG tablet    Sig: Take 1 tablet po QOD for 5 doses    Dispense:  5 tablet    Refill:  0    Order Specific Question:   Supervising Provider    Answer:   Lyndon Code [[id number]]    Time spent: 30 Minutes

## 2020-07-11 DIAGNOSIS — Z23 Encounter for immunization: Secondary | ICD-10-CM | POA: Insufficient documentation

## 2020-07-11 DIAGNOSIS — B372 Candidiasis of skin and nail: Secondary | ICD-10-CM | POA: Insufficient documentation

## 2020-07-21 ENCOUNTER — Other Ambulatory Visit: Payer: Self-pay | Admitting: Nurse Practitioner

## 2020-07-21 LAB — BASIC METABOLIC PANEL: Glucose: 99

## 2020-07-22 LAB — COMPREHENSIVE METABOLIC PANEL
ALT: 20 IU/L (ref 0–32)
AST: 17 IU/L (ref 0–40)
Albumin/Globulin Ratio: 1.9 (ref 1.2–2.2)
Albumin: 4.4 g/dL (ref 3.8–4.8)
Alkaline Phosphatase: 105 IU/L (ref 44–121)
BUN/Creatinine Ratio: 20 (ref 12–28)
BUN: 13 mg/dL (ref 8–27)
Bilirubin Total: 0.4 mg/dL (ref 0.0–1.2)
CO2: 23 mmol/L (ref 20–29)
Calcium: 9.6 mg/dL (ref 8.7–10.3)
Chloride: 104 mmol/L (ref 96–106)
Creatinine, Ser: 0.65 mg/dL (ref 0.57–1.00)
GFR calc Af Amer: 109 mL/min/{1.73_m2} (ref >59)
GFR calc non Af Amer: 94 mL/min/{1.73_m2} (ref >59)
Globulin, Total: 2.3 g/dL (ref 1.5–4.5)
Glucose: 99 mg/dL (ref 65–99)
Potassium: 5.1 mmol/L (ref 3.5–5.2)
Sodium: 143 mmol/L (ref 134–144)
Total Protein: 6.7 g/dL (ref 6.0–8.5)

## 2020-07-22 LAB — HGB A1C W/O EAG: Hgb A1c MFr Bld: 5.9 % — ABNORMAL HIGH (ref 4.8–5.6)

## 2020-07-22 LAB — CBC
Hematocrit: 50.3 % — ABNORMAL HIGH (ref 34.0–46.6)
Hemoglobin: 16.8 g/dL — ABNORMAL HIGH (ref 11.1–15.9)
MCH: 29.1 pg (ref 26.6–33.0)
MCHC: 33.4 g/dL (ref 31.5–35.7)
MCV: 87 fL (ref 79–97)
Platelets: 259 10*3/uL (ref 150–450)
RBC: 5.78 x10E6/uL — ABNORMAL HIGH (ref 3.77–5.28)
RDW: 13.4 % (ref 11.7–15.4)
WBC: 6.2 10*3/uL (ref 3.4–10.8)

## 2020-07-22 LAB — VITAMIN D 25 HYDROXY (VIT D DEFICIENCY, FRACTURES): Vit D, 25-Hydroxy: 37.7 ng/mL (ref 30.0–100.0)

## 2020-07-22 LAB — LIPID PANEL WITH LDL/HDL RATIO
Cholesterol, Total: 216 mg/dL — ABNORMAL HIGH (ref 100–199)
HDL: 74 mg/dL (ref >39)
LDL Chol Calc (NIH): 119 mg/dL — ABNORMAL HIGH (ref 0–99)
LDL/HDL Ratio: 1.6 ratio (ref 0.0–3.2)
Triglycerides: 134 mg/dL (ref 0–149)
VLDL Cholesterol Cal: 23 mg/dL (ref 5–40)

## 2020-07-25 NOTE — Progress Notes (Signed)
Discuss with patient at visit 12/6

## 2020-08-03 ENCOUNTER — Encounter: Payer: Self-pay | Admitting: Nurse Practitioner

## 2020-08-03 ENCOUNTER — Ambulatory Visit (INDEPENDENT_AMBULATORY_CARE_PROVIDER_SITE_OTHER): Payer: 59 | Admitting: Nurse Practitioner

## 2020-08-03 ENCOUNTER — Other Ambulatory Visit: Payer: Self-pay

## 2020-08-03 VITALS — BP 133/79 | HR 74 | Temp 97.8°F | Resp 16 | Ht 64.0 in | Wt 194.2 lb

## 2020-08-03 DIAGNOSIS — Z6834 Body mass index (BMI) 34.0-34.9, adult: Secondary | ICD-10-CM

## 2020-08-03 DIAGNOSIS — I10 Essential (primary) hypertension: Secondary | ICD-10-CM | POA: Diagnosis not present

## 2020-08-03 DIAGNOSIS — R7303 Prediabetes: Secondary | ICD-10-CM | POA: Diagnosis not present

## 2020-08-03 DIAGNOSIS — E782 Mixed hyperlipidemia: Secondary | ICD-10-CM

## 2020-08-03 DIAGNOSIS — R300 Dysuria: Secondary | ICD-10-CM

## 2020-08-03 DIAGNOSIS — Z0001 Encounter for general adult medical examination with abnormal findings: Secondary | ICD-10-CM | POA: Diagnosis not present

## 2020-08-03 MED ORDER — RYBELSUS 3 MG PO TABS: 3.0000 mg | ORAL_TABLET | Freq: Every day | ORAL | 0 refills | Status: DC

## 2020-08-03 NOTE — Progress Notes (Signed)
[facility] [place], [place] [address]  Internal MEDICINE  Office Visit Note  Patient Name: Valerie Thornton  [id number]  [id number]  Date of Service: 08/03/2020   Pt is here for routine health maintenance examination  Chief Complaint  Patient presents with  . Annual Exam  . Hypertension  . Quality Metric Gaps    Hep C  . Controlled substance form    reviewed with PT     The patient is here for health maintenance exam. She did have routine fasting labs done prior to this visit. Her lipid panel showing mild elevation of LDL and total cholesterol. There LDL/HDL ration was 1.6, indicating about 1/2 average risk for cardiovascular issues in the future. She currently takes lovastatin every day. Her HgbA1c is 5.9. her Hgb and Hct are mildly elevated which has been chronic and stable.  Mammogram done 04/2020 was negative.  Big concern about weight gain. Has tried stimulant medication to help weight loss in the past. Was somewhat successful but did have negative side effects.     Current Medication: Outpatient Encounter Medications as of 08/03/2020  Medication Sig  . amLODipine (NORVASC) 5 MG tablet Take 1 tablet (5 mg total) by mouth daily.  . benazepril (LOTENSIN) 5 MG tablet Take 1 tablet (5 mg total) by mouth daily.  . clopidogrel (PLAVIX) 75 MG tablet Take 1 tablet (75 mg total) by mouth daily.  . clotrimazole-betamethasone (LOTRISONE) cream Apply 1 application topically 2 (two) times daily.  . fluconazole (DIFLUCAN) 150 MG tablet Take 1 tablet po QOD for 5 doses  . lovastatin (MEVACOR) 40 MG tablet Take 1 tablet (40 mg total) by mouth at bedtime.  . triamcinolone (KENALOG) 0.025 % cream Apply 1 application topically 2 (two) times daily.  . Semaglutide (RYBELSUS) 3 MG TABS Take 3 mg by mouth daily.   No facility-administered encounter medications on file as of 08/03/2020.    Surgical History: Past Surgical History:  Procedure Laterality Date  .  TONSILLECTOMY AND ADENOIDECTOMY     pt was 64 years old    Medical History: Past Medical History:  Diagnosis Date  . Erythrocytosis   . Hypertension   . 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 TIA (transient ischemic attack) 2015   affected right side but completely resolved    Family History: Family History  Problem Relation Age of Onset  . Breast cancer Maternal Aunt 70  . Hypertension Mother   . Hypertension Sister       Review of Systems  Constitutional: Negative for activity change, chills, fatigue and unexpected weight change.       Patient has stable weight since her last visit.   HENT: Negative for congestion, postnasal drip, rhinorrhea, sneezing and sore throat.   Respiratory: Negative for cough, chest tightness, shortness of breath and wheezing.   Cardiovascular: Negative for chest pain and palpitations.  Gastrointestinal: Positive for constipation. Negative for abdominal pain, diarrhea, nausea and vomiting.  Genitourinary: Negative for dysuria and frequency.  Musculoskeletal: Positive for arthralgias and myalgias. Negative for back pain, joint swelling and neck pain.  Skin: Negative for rash.  Neurological: Negative.  Negative for tremors and numbness.  Hematological: Negative for adenopathy. Does not bruise/bleed easily.  Psychiatric/Behavioral: Negative for behavioral problems (Depression), sleep disturbance and suicidal ideas. The patient is not nervous/anxious.      Today's Vitals   08/03/20 0930  BP: 133/79  Pulse:  74  Resp: 16  Temp: 97.8 F (36.6 C)  SpO2: 96%  Weight: 194 lb 3.2 oz (88.1 kg)  Height: _0  (1.626 m)   Body mass index is 33.33 kg/m.  Physical Exam Vitals and nursing note reviewed.  Constitutional:      General: She is not in acute distress.    Appearance: Normal appearance. She is well-developed. She is obese. She is not diaphoretic.  HENT:     Head: Normocephalic and atraumatic.      Nose: Nose normal.     Mouth/Throat:     Pharynx: No oropharyngeal exudate.  Eyes:     Pupils: Pupils are equal, round, and reactive to light.  Neck:     Thyroid: No thyromegaly.     Vascular: No carotid bruit or JVD.     Trachea: No tracheal deviation.  Cardiovascular:     Rate and Rhythm: Normal rate and regular rhythm.     Pulses: Normal pulses.     Heart sounds: Normal heart sounds. No murmur heard.  No friction rub. No gallop.   Pulmonary:     Effort: Pulmonary effort is normal. No respiratory distress.     Breath sounds: Normal breath sounds. No wheezing or rales.  Chest:     Chest wall: No tenderness.  Abdominal:     General: Bowel sounds are normal.     Palpations: Abdomen is soft.     Tenderness: There is no abdominal tenderness.  Musculoskeletal:        General: Normal range of motion.     Cervical back: Normal range of motion and neck supple.  Lymphadenopathy:     Cervical: No cervical adenopathy.  Skin:    General: Skin is warm and dry.     Capillary Refill: Capillary refill takes less than 2 seconds.  Neurological:     General: No focal deficit present.     Mental Status: She is alert and oriented to person, place, and time.     Cranial Nerves: No cranial nerve deficit.  Psychiatric:        Mood and Affect: Mood normal.        Behavior: Behavior normal.        Thought Content: Thought content normal.        Judgment: Judgment normal.      LABS: Recent Results (from the past 2160 hour(s))  Comprehensive metabolic panel     Status: None   Collection Time: 07/21/20  7:05 AM  Result Value Ref Range   Glucose 99 65 - 99 mg/dL   BUN 13 8 - 27 mg/dL   Creatinine, Ser 0.65 0.57 - 1.00 mg/dL   GFR calc non Af Amer 94 >59 mL/min/1.73   GFR calc Af Amer 109 >59 mL/min/1.73    Comment: **In accordance with recommendations from the NKF-ASN Task force,**   [facility] is in the process of updating its eGFR calculation to the   2021 CKD-EPI creatinine equation  that estimates kidney function   without a race variable.    BUN/Creatinine Ratio 20 12 - 28   Sodium 143 134 - 144 mmol/L   Potassium 5.1 3.5 - 5.2 mmol/L   Chloride 104 96 - 106 mmol/L   CO2 23 20 - 29 mmol/L   Calcium 9.6 8.7 - 10.3 mg/dL   Total Protein 6.7 6.0 - 8.5 g/dL   Albumin 4.4 3.8 - 4.8 g/dL   Globulin, Total 2.3 1.5 - 4.5 g/dL   Albumin/Globulin Ratio 1.9  1.2 - 2.2   Bilirubin Total 0.4 0.0 - 1.2 mg/dL   Alkaline Phosphatase 105 44 - 121 IU/L    Comment:               **Please note reference interval change**   AST 17 0 - 40 IU/L   ALT 20 0 - 32 IU/L  CBC     Status: Abnormal   Collection Time: 07/21/20  7:05 AM  Result Value Ref Range   WBC 6.2 3.4 - 10.8 x10E3/uL   RBC 5.78 (H) 3.77 - 5.28 x10E6/uL   Hemoglobin 16.8 (H) 11.1 - 15.9 g/dL   Hematocrit 50.3 (H) 34.0 - 46.6 %   MCV 87 79 - 97 fL   MCH 29.1 26.6 - 33.0 pg   MCHC 33.4 31 - 35 g/dL   RDW 13.4 11.7 - 15.4 %   Platelets 259 150 - 450 x10E3/uL  Lipid Panel With LDL/HDL Ratio     Status: Abnormal   Collection Time: 07/21/20  7:05 AM  Result Value Ref Range   Cholesterol, Total 216 (H) 100 - 199 mg/dL   Triglycerides 134 0 - 149 mg/dL   HDL 74 >39 mg/dL   VLDL Cholesterol Cal 23 5 - 40 mg/dL   LDL Chol Calc (NIH) 119 (H) 0 - 99 mg/dL   LDL/HDL Ratio 1.6 0.0 - 3.2 ratio    Comment:                                     LDL/HDL Ratio                                             Men  Women                               1/2 Avg.Risk  1.0    1.5                                   Avg.Risk  3.6    3.2                                2X Avg.Risk  6.2    5.0                                3X Avg.Risk  8.0    6.1   Hgb A1c w/o eAG     Status: Abnormal   Collection Time: 07/21/20  7:05 AM  Result Value Ref Range   Hgb A1c MFr Bld 5.9 (H) 4.8 - 5.6 %    Comment:          Prediabetes: 5.7 - 6.4          Diabetes: >6.4          Glycemic control for adults with diabetes: <7.0   VITAMIN D 25 Hydroxy (Vit-D  Deficiency, Fractures)     Status: None   Collection Time: 07/21/20  7:05 AM  Result Value Ref Range   Vit D, 25-Hydroxy 37.7 30.0 -  100.0 ng/mL    Comment: Vitamin D deficiency has been defined by the [place] practice guideline as a level of serum 25-OH vitamin D less than 20 ng/mL (1,2). The Endocrine Society went on to further define vitamin D insufficiency as a level between 21 and 29 ng/mL (2). 1. IOM (Institute of Medicine). 2010. Dietary reference    intakes for calcium and D. [place]: The    [employer]. 2. Holick MF, Binkley [place], Bischoff-Ferrari HA, et al.    Evaluation, treatment, and prevention of vitamin D    deficiency: an Endocrine Society clinical practice    guideline. JCEM. 2011 Jul; 96(7):1911-30.     Assessment/Plan: 1. Encounter for general adult medical examination with abnormal findings Annual health maintenance exam today.   2. Prediabetes Reviewed labs with normal glucose and HgbA1c 5.9. start rybelsus 52m daily. Discussed diet and lifestyle modifications necessary to keep sugar low.  - Semaglutide (RYBELSUS) 3 MG TABS; Take 3 mg by mouth daily.  Dispense: 30 tablet; Refill: 0  3. Essential hypertension Stable. Continue bp medication as prescribed   4. Mixed hyperlipidemia Reviewed labs showing mild elevation of LDL and total cholesterol with HDL/LDL ration of 1.6. continue lovastatin as prescribed. Diet and lifestyle modifications as discussed.   5. BMI 34.0-34.9,adult Add rybelsus 379mdaily. Discussed limiting calorie intake to 1200 calories per days and incorporating exercise into her daily routine. encoraged her to get app for phone that will help her track her calorie count daily.   6. Dysuria - UA/M w/rflx Culture, Routine  General Counseling: Katarren sabreenderstanding of the findings of todays visit and agrees with plan of treatment. I have discussed any further diagnostic evaluation  that [place] be needed or ordered today. We also reviewed her medications today. she has been encouraged to call the office with any questions or concerns that should arise related to todays visit.    Counseling:  Obesity Counseling: Risk Assessment: An assessment of behavioral risk factors was made today and includes lack of exercise sedentary lifestyle, lack of portion control and poor dietary habits.  Risk Modification Advice: She was counseled on portion control guidelines. Restricting daily caloric intake to 1200 calories per day. The detrimental long term effects of obesity on her health and ongoing poor compliance was also discussed with the patient.  This patient was seen by HeLeretha PolNP Collaboration with Dr FoLavera Guises a part of collaborative care agreement  Orders Placed This Encounter  Procedures  . UA/M w/rflx Culture, Routine    Meds ordered this encounter  Medications  . Semaglutide (RYBELSUS) 3 MG TABS    Sig: Take 3 mg by mouth daily.    Dispense:  30 tablet    Refill:  0    Sample box provided at visit today    Order Specific Question:   Supervising Provider    Answer:   KHLavera Guise1[[id number]]  Total time spent: 4528inutes  Time spent includes review of chart, medications, test results, and follow up plan with the patient.     FoLavera GuiseMD  Internal Medicine

## 2020-08-04 LAB — UA/M W/RFLX CULTURE, ROUTINE
Bilirubin, UA: NEGATIVE
Glucose, UA: NEGATIVE
Ketones, UA: NEGATIVE
Leukocytes,UA: NEGATIVE
Nitrite, UA: NEGATIVE
Protein,UA: NEGATIVE
RBC, UA: NEGATIVE
Specific Gravity, UA: 1.025 (ref 1.005–1.030)
Urobilinogen, Ur: 0.2 mg/dL (ref 0.2–1.0)
pH, UA: 5.5 (ref 5.0–7.5)

## 2020-08-04 LAB — MICROSCOPIC EXAMINATION
Casts: NONE SEEN /lpf
Epithelial Cells (non renal): >10 /hpf — AB (ref 0–10)

## 2020-08-31 ENCOUNTER — Other Ambulatory Visit: Payer: Self-pay

## 2020-08-31 DIAGNOSIS — E782 Mixed hyperlipidemia: Secondary | ICD-10-CM

## 2020-08-31 DIAGNOSIS — Z8673 Personal history of transient ischemic attack (TIA), and cerebral infarction without residual deficits: Secondary | ICD-10-CM

## 2020-08-31 DIAGNOSIS — I10 Essential (primary) hypertension: Secondary | ICD-10-CM

## 2020-08-31 MED ORDER — AMLODIPINE BESYLATE 5 MG PO TABS: 5.0000 mg | ORAL_TABLET | Freq: Every day | ORAL | 1 refills | Status: DC

## 2020-08-31 MED ORDER — LOVASTATIN 40 MG PO TABS: 40.0000 mg | ORAL_TABLET | Freq: Every day | ORAL | 1 refills | Status: DC

## 2020-08-31 MED ORDER — BENAZEPRIL HCL 5 MG PO TABS: 5.0000 mg | ORAL_TABLET | Freq: Every day | ORAL | 1 refills | Status: DC

## 2020-08-31 MED ORDER — CLOPIDOGREL BISULFATE 75 MG PO TABS: 75.0000 mg | ORAL_TABLET | Freq: Every day | ORAL | 1 refills | Status: DC

## 2020-09-03 ENCOUNTER — Other Ambulatory Visit: Payer: Self-pay

## 2020-09-03 ENCOUNTER — Other Ambulatory Visit: Payer: Self-pay | Admitting: Nurse Practitioner

## 2020-09-03 ENCOUNTER — Encounter: Payer: Self-pay | Admitting: Nurse Practitioner

## 2020-09-03 ENCOUNTER — Ambulatory Visit: Payer: 59 | Admitting: Nurse Practitioner

## 2020-09-03 VITALS — BP 133/79 | HR 73 | Temp 98.3°F | Resp 16 | Ht 64.0 in | Wt 192.0 lb

## 2020-09-03 DIAGNOSIS — E782 Mixed hyperlipidemia: Secondary | ICD-10-CM

## 2020-09-03 DIAGNOSIS — I10 Essential (primary) hypertension: Secondary | ICD-10-CM

## 2020-09-03 DIAGNOSIS — R7303 Prediabetes: Secondary | ICD-10-CM

## 2020-09-03 DIAGNOSIS — Z8673 Personal history of transient ischemic attack (TIA), and cerebral infarction without residual deficits: Secondary | ICD-10-CM | POA: Diagnosis not present

## 2020-09-03 MED ORDER — RYBELSUS 7 MG PO TABS: 1.0000 | ORAL_TABLET | Freq: Every day | ORAL | 3 refills | Status: DC

## 2020-09-03 NOTE — Progress Notes (Signed)
[facility] [place], [place] [address]  Internal MEDICINE  Office Visit Note  Patient Name: Valerie Thornton  [id number]  [id number]  Date of Service: 09/18/2020  Chief Complaint  Patient presents with  . Hypertension    The patient is here for follow up visit.  -started rybelsus due to impaired fasting glucose and desire for weight loss.  -has tolerated the medication well. Reports no negative side effects. -weight loss of two pounds over christmas and new year's holidays.  -blood pressure remains well controlled.       Current Medication: Outpatient Encounter Medications as of 09/03/2020  Medication Sig  . amLODipine (NORVASC) 5 MG tablet Take 1 tablet (5 mg total) by mouth daily.  . benazepril (LOTENSIN) 5 MG tablet Take 1 tablet (5 mg total) by mouth daily.  . clopidogrel (PLAVIX) 75 MG tablet Take 1 tablet (75 mg total) by mouth daily.  . clotrimazole-betamethasone (LOTRISONE) cream Apply 1 application topically 2 (two) times daily.  . fluconazole (DIFLUCAN) 150 MG tablet Take 1 tablet po QOD for 5 doses  . lovastatin (MEVACOR) 40 MG tablet Take 1 tablet (40 mg total) by mouth at bedtime.  . triamcinolone (KENALOG) 0.025 % cream Apply 1 application topically 2 (two) times daily.  . [DISCONTINUED] Semaglutide (RYBELSUS) 3 MG TABS Take 3 mg by mouth daily.  . [DISCONTINUED] Semaglutide (RYBELSUS) 7 MG TABS Take 1 tablet by mouth daily.   No facility-administered encounter medications on file as of 09/03/2020.    Surgical History: Past Surgical History:  Procedure Laterality Date  . TONSILLECTOMY AND ADENOIDECTOMY     pt was 65 years old    Medical History: Past Medical History:  Diagnosis Date  . Erythrocytosis   . Hypertension   . 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 TIA (transient ischemic attack) 2015   affected right side but completely resolved    Family History: Family History  Problem  Relation Age of Onset  . Breast cancer Maternal Aunt 70  . Hypertension Mother   . Hypertension Sister     Social History   Socioeconomic History  . Marital status: Married    Spouse name: Not on file  . Number of children: Not on file  . Years of education: Not on file  . Highest education level: Not on file  Occupational History  . Not on file  Tobacco Use  . Smoking status: Never Smoker  . Smokeless tobacco: Never Used  Substance and Sexual Activity  . Alcohol use: Yes    Comment: few times a month   . Drug use: No  . Sexual activity: Not on file  Other Topics Concern  . Not on file  Social History Narrative  . Not on file   Social Determinants of Health   Financial Resource Strain: Not on file  Food Insecurity: Not on file  Transportation Needs: Not on file  Physical Activity: Not on file  Stress: Not on file  Social Connections: Not on file  Intimate Partner Violence: Not on file      Review of Systems  Constitutional: Negative for activity change, chills, fatigue and unexpected weight change.       Two pound weight loss since her most recent visit.   HENT: Negative for congestion, postnasal drip, rhinorrhea, sneezing and sore throat.   Respiratory: Negative for cough, chest tightness, shortness of breath and wheezing.   Cardiovascular:  Negative for chest pain and palpitations.  Gastrointestinal: Positive for constipation. Negative for abdominal pain, diarrhea, nausea and vomiting.  Endocrine: Negative for cold intolerance, heat intolerance, polydipsia and polyuria.  Musculoskeletal: Positive for arthralgias and myalgias. Negative for back pain, joint swelling and neck pain.  Skin: Negative for rash.  Allergic/Immunologic: Negative for environmental allergies.  Neurological: Negative for dizziness, tremors, numbness and headaches.  Hematological: Negative for adenopathy. Does not bruise/bleed easily.  Psychiatric/Behavioral: Negative for behavioral problems  (Depression), sleep disturbance and suicidal ideas. The patient is not nervous/anxious.     Today's Vitals   09/03/20 0939  BP: 133/79  Pulse: 73  Resp: 16  Temp: 98.3 F (36.8 C)  SpO2: 98%  Weight: 192 lb (87.1 kg)  Height: 5\' 4"  (1.626 m)   Body mass index is 32.96 kg/m.  Physical Exam Vitals and nursing note reviewed.  Constitutional:      General: She is not in acute distress.    Appearance: Normal appearance. She is well-developed and well-nourished. She is not diaphoretic.  HENT:     Head: Normocephalic and atraumatic.     Nose: Nose normal.     Mouth/Throat:     Mouth: Oropharynx is clear and moist.     Pharynx: No oropharyngeal exudate.  Eyes:     Extraocular Movements: EOM normal.     Pupils: Pupils are equal, round, and reactive to light.  Neck:     Thyroid: No thyromegaly.     Vascular: No JVD.     Trachea: No tracheal deviation.  Cardiovascular:     Rate and Rhythm: Normal rate and regular rhythm.     Heart sounds: Normal heart sounds. No murmur heard. No friction rub. No gallop.   Pulmonary:     Effort: Pulmonary effort is normal. No respiratory distress.     Breath sounds: Normal breath sounds. No wheezing or rales.  Chest:     Chest wall: No tenderness.  Abdominal:     General: Bowel sounds are normal.     Palpations: Abdomen is soft.     Tenderness: There is no abdominal tenderness.  Musculoskeletal:        General: Normal range of motion.     Cervical back: Normal range of motion and neck supple.  Lymphadenopathy:     Cervical: No cervical adenopathy.  Skin:    General: Skin is warm and dry.  Neurological:     Mental Status: She is alert and oriented to person, place, and time.     Cranial Nerves: No cranial nerve deficit.  Psychiatric:        Mood and Affect: Mood and affect and mood normal.        Behavior: Behavior normal.        Thought Content: Thought content normal.        Judgment: Judgment normal.    Assessment/Plan: 1.  Essential hypertension Stable. Continue bp medication as prescribed   2. Prediabetes Increase dose rybelss to 7mg  daily. New prescription sent to her pharmacy. She should continue to follow a low calorie, low carbohydrate diet. She should increase protein intake and incorporate exercise into her daily routine.   3. Mixed hyperlipidemia Stable. Continue lovastatin as prescribed   4. History of TIAs Stable. Continue plavix and lovastatin as previously prescribed.   General Counseling: dnya hickle understanding of the findings of todays visit and agrees with plan of treatment. I have discussed any further diagnostic evaluation that Ambrosius be needed or ordered today. We  also reviewed her medications today. she has been encouraged to call the office with any questions or concerns that should arise related to todays visit.   This patient was seen by Vincent Gros FNP Collaboration with Dr Lyndon Code as a part of collaborative care agreement  Meds ordered this encounter  Medications  . DISCONTD: Semaglutide (RYBELSUS) 7 MG TABS    Sig: Take 1 tablet by mouth daily.    Dispense:  30 tablet    Refill:  3    Has completed 30 day trial on 3mg . Increasing dose to 7mg  daily. Patient has copay card.    Order Specific Question:   Supervising Provider    Answer:   [[id number]]    Total time spent: 25 Minutes   Time spent includes review of chart, medications, test results, and follow up plan with the patient.      Dr Internal medicine

## 2020-09-03 NOTE — Telephone Encounter (Signed)
Yes I will work on it.

## 2020-09-03 NOTE — Telephone Encounter (Signed)
Hey. Can we do prior authorization for this? Thanks.

## 2020-09-09 ENCOUNTER — Telehealth: Payer: Self-pay

## 2020-09-09 NOTE — Telephone Encounter (Signed)
I sent the PA and it was approved thru 09/09/2021 and noted in patients chart

## 2020-09-09 NOTE — Telephone Encounter (Signed)
PA for Rybelsus 7 mg tablets was approved thru 09/09/2021 case # [id number]

## 2020-10-09 ENCOUNTER — Encounter: Payer: Self-pay | Admitting: Internal Medicine

## 2020-10-09 ENCOUNTER — Other Ambulatory Visit: Payer: Self-pay | Admitting: Internal Medicine

## 2020-10-09 ENCOUNTER — Ambulatory Visit: Payer: 59 | Admitting: Internal Medicine

## 2020-10-09 VITALS — BP 134/80 | HR 95 | Temp 98.3°F | Resp 16 | Ht 64.0 in | Wt 193.4 lb

## 2020-10-09 DIAGNOSIS — N3001 Acute cystitis with hematuria: Secondary | ICD-10-CM

## 2020-10-09 LAB — POCT URINALYSIS DIPSTICK
Bilirubin, UA: NEGATIVE
Glucose, UA: NEGATIVE
Ketones, UA: NEGATIVE
Leukocytes, UA: NEGATIVE
Nitrite, UA: NEGATIVE
Protein, UA: NEGATIVE
Spec Grav, UA: 1.01 (ref 1.010–1.025)
Urobilinogen, UA: 0.2 E.U./dL
pH, UA: 6 (ref 5.0–8.0)

## 2020-10-09 MED ORDER — CIPROFLOXACIN HCL 500 MG PO TABS: 500.0000 mg | ORAL_TABLET | Freq: Two times a day (BID) | ORAL | 0 refills | Status: DC

## 2020-10-09 NOTE — Progress Notes (Signed)
[facility] [address] [place], [place] [address]  Internal MEDICINE  Office Visit Note  Patient Name: Valerie Thornton  [id number]  [id number]  Date of Service: 10/11/2020  Chief Complaint  Patient presents with  . Urinary Tract Infection    Feels urgency, seen a little blood when wiping, started yesterday    HPI   Pt is here for acute and sick visit C/O urinary urgency, did see blood, she is sure it is not vaginal. Denies any fever pr chills   Current Medication: Outpatient Encounter Medications as of 10/09/2020  Medication Sig  . amLODipine (NORVASC) 5 MG tablet Take 1 tablet (5 mg total) by mouth daily.  . benazepril (LOTENSIN) 5 MG tablet Take 1 tablet (5 mg total) by mouth daily.  . clopidogrel (PLAVIX) 75 MG tablet Take 1 tablet (75 mg total) by mouth daily.  . clotrimazole-betamethasone (LOTRISONE) cream Apply 1 application topically 2 (two) times daily.  Marland Kitchen lovastatin (MEVACOR) 40 MG tablet Take 1 tablet (40 mg total) by mouth at bedtime.  . RYBELSUS 7 MG TABS TAKE 1 TABLET BY MOUTH EVERY DAY  . triamcinolone (KENALOG) 0.025 % cream Apply 1 application topically 2 (two) times daily.  . [DISCONTINUED] ciprofloxacin (CIPRO) 500 MG tablet Take 1 tablet (500 mg total) by mouth 2 (two) times daily.  . fluconazole (DIFLUCAN) 150 MG tablet Take 1 tablet po QOD for 5 doses (Patient not taking: Reported on 10/09/2020)   No facility-administered encounter medications on file as of 10/09/2020.    Surgical History: Past Surgical History:  Procedure Laterality Date  . TONSILLECTOMY AND ADENOIDECTOMY     pt was 65 years old    Medical History: Past Medical History:  Diagnosis Date  . Erythrocytosis   . Hypertension   . 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 TIA (transient ischemic attack) 2015   affected right side but completely resolved    Family History: Family History  Problem Relation Age of Onset  . Breast  cancer Maternal Aunt 70  . Hypertension Mother   . Hypertension Sister     Social History   Socioeconomic History  . Marital status: Married    Spouse name: Not on file  . Number of children: Not on file  . Years of education: Not on file  . Highest education level: Not on file  Occupational History  . Not on file  Tobacco Use  . Smoking status: Never Smoker  . Smokeless tobacco: Never Used  Substance and Sexual Activity  . Alcohol use: Yes    Comment: few times a month   . Drug use: No  . Sexual activity: Not on file  Other Topics Concern  . Not on file  Social History Narrative  . Not on file   Social Determinants of Health   Financial Resource Strain: Not on file  Food Insecurity: Not on file  Transportation Needs: Not on file  Physical Activity: Not on file  Stress: Not on file  Social Connections: Not on file  Intimate Partner Violence: Not on file    Review of Systems  Constitutional: Negative for fatigue and fever.  HENT: Negative for congestion, mouth sores and postnasal drip.   Respiratory: Negative for cough.   Cardiovascular: Negative for chest pain.  Genitourinary: Positive for flank pain and urgency.  Psychiatric/Behavioral: Negative.     Vital Signs: BP 134/80   Pulse 95   Temp  98.3 F (36.8 C)   Resp 16   Ht 5\' 4"  (1.626 m)   Wt 193 lb 6.4 oz (87.7 kg)   SpO2 96%   BMI 33.20 kg/m    Physical Exam Cardiovascular:     Rate and Rhythm: Normal rate and regular rhythm.     Pulses: Normal pulses.     Heart sounds: Normal heart sounds.  Pulmonary:     Effort: Pulmonary effort is normal.  Abdominal:     General: Bowel sounds are normal.  Skin:    General: Skin is warm.  Neurological:     Mental Status: She is alert.        Assessment/Plan: 1. Acute cystitis with hematuria - POCT Urinalysis Dipstick - CULTURE, URINE COMPREHENSIVE Ecoli , Levaquin 500 mg po qd x 7 days   General Counseling: Orine verbalizes understanding of the  findings of todays visit and agrees with plan of treatment. I have discussed any further diagnostic evaluation that Towles be needed or ordered today. We also reviewed her medications today. she has been encouraged to call the office with any questions or concerns that should arise related to todays visit.    Orders Placed This Encounter  Procedures  . CULTURE, URINE COMPREHENSIVE  . POCT Urinalysis Dipstick    Meds ordered this encounter  Medications  . DISCONTD: ciprofloxacin (CIPRO) 500 MG tablet    Sig: Take 1 tablet (500 mg total) by mouth 2 (two) times daily.    Dispense:  20 tablet    Refill:  0    Total time spent:15 Minutes Time spent includes review of chart, medications, test results, and follow up plan with the patient.   [place] Controlled Substance Database was reviewed by me.   Dr Clydie Braun Internal medicine

## 2020-10-09 NOTE — Telephone Encounter (Signed)
Can u look into this for me

## 2020-10-09 NOTE — Telephone Encounter (Signed)
Spoke with phar is error

## 2020-10-13 LAB — CULTURE, URINE COMPREHENSIVE

## 2020-12-02 ENCOUNTER — Other Ambulatory Visit: Payer: Self-pay

## 2020-12-02 ENCOUNTER — Ambulatory Visit (INDEPENDENT_AMBULATORY_CARE_PROVIDER_SITE_OTHER): Payer: 59 | Admitting: Hospice and Palliative Medicine

## 2020-12-02 ENCOUNTER — Encounter: Payer: Self-pay | Admitting: Hospice and Palliative Medicine

## 2020-12-02 VITALS — BP 118/70 | HR 83 | Temp 97.3°F | Resp 16 | Ht 64.0 in | Wt 192.2 lb

## 2020-12-02 DIAGNOSIS — R7303 Prediabetes: Secondary | ICD-10-CM

## 2020-12-02 DIAGNOSIS — D751 Secondary polycythemia: Secondary | ICD-10-CM

## 2020-12-02 DIAGNOSIS — Z6832 Body mass index (BMI) 32.0-32.9, adult: Secondary | ICD-10-CM | POA: Diagnosis not present

## 2020-12-02 DIAGNOSIS — I10 Essential (primary) hypertension: Secondary | ICD-10-CM

## 2020-12-02 DIAGNOSIS — R7301 Impaired fasting glucose: Secondary | ICD-10-CM

## 2020-12-02 LAB — POCT GLYCOSYLATED HEMOGLOBIN (HGB A1C): Hemoglobin A1C: 5.6 % (ref 4.0–5.6)

## 2020-12-02 NOTE — Progress Notes (Signed)
[facility] [address] [place], [place] [address]  Internal MEDICINE  Office Visit Note  Patient Name: Valerie Thornton  [id number]  [id number]  Date of Service: 12/04/2020  Chief Complaint  Patient presents with  . Follow-up    Discuss meds  . Hypertension    HPI Patient is here for routine follow-up Continues to take Rybelsus daily for prediabetes and weight control, tolerating well without negative side effects Received a letter from her insurance company that they [place] no longer provide coverage for medication but she has not yet had any issues with getting medication filled Noted stable weight since last visit  Has had ONO in the past which was normal--known history of erythrocytosis, denies current or previous cigarette smoking Has had work up in the past, polycythemia vera work-up negative, normal erythropoietin levels at that time, primary diagnosis secondary erythrocytosis--recommended monitoring of hgb hct levels Remains asymptomatic at this time  Current Medication: Outpatient Encounter Medications as of 12/02/2020  Medication Sig  . amLODipine (NORVASC) 5 MG tablet Take 1 tablet (5 mg total) by mouth daily.  . benazepril (LOTENSIN) 5 MG tablet Take 1 tablet (5 mg total) by mouth daily.  . clopidogrel (PLAVIX) 75 MG tablet Take 1 tablet (75 mg total) by mouth daily.  . clotrimazole-betamethasone (LOTRISONE) cream Apply 1 application topically 2 (two) times daily.  Marland Kitchen lovastatin (MEVACOR) 40 MG tablet Take 1 tablet (40 mg total) by mouth at bedtime.  . RYBELSUS 7 MG TABS TAKE 1 TABLET BY MOUTH EVERY DAY  . triamcinolone (KENALOG) 0.025 % cream Apply 1 application topically 2 (two) times daily.  . [DISCONTINUED] fluconazole (DIFLUCAN) 150 MG tablet Take 1 tablet po QOD for 5 doses (Patient not taking: Reported on 10/09/2020)  . [DISCONTINUED] levofloxacin (LEVAQUIN) 500 MG tablet Take 1 tablet (500 mg total) by mouth daily.   No facility-administered encounter  medications on file as of 12/02/2020.    Surgical History: Past Surgical History:  Procedure Laterality Date  . TONSILLECTOMY AND ADENOIDECTOMY     pt was 65 years old    Medical History: Past Medical History:  Diagnosis Date  . Erythrocytosis   . Hypertension   . 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 TIA (transient ischemic attack) 2015   affected right side but completely resolved    Family History: Family History  Problem Relation Age of Onset  . Breast cancer Maternal Aunt 70  . Hypertension Mother   . Hypertension Sister     Social History   Socioeconomic History  . Marital status: Married    Spouse name: Not on file  . Number of children: Not on file  . Years of education: Not on file  . Highest education level: Not on file  Occupational History  . Not on file  Tobacco Use  . Smoking status: Never Smoker  . Smokeless tobacco: Never Used  Substance and Sexual Activity  . Alcohol use: Yes    Comment: few times a month   . Drug use: No  . Sexual activity: Not on file  Other Topics Concern  . Not on file  Social History Narrative  . Not on file   Social Determinants of Health   Financial Resource Strain: Not on file  Food Insecurity: Not on file  Transportation Needs: Not on file  Physical Activity: Not on file  Stress: Not on file  Social Connections: Not on file  Intimate Partner Violence: Not on file      Review of Systems  Constitutional: Negative for chills, diaphoresis and fatigue.  HENT: Negative for ear pain, postnasal drip and sinus pressure.   Eyes: Negative for photophobia, discharge, redness, itching and visual disturbance.  Respiratory: Negative for cough, shortness of breath and wheezing.   Cardiovascular: Negative for chest pain, palpitations and leg swelling.  Gastrointestinal: Negative for abdominal pain, constipation, diarrhea, nausea and vomiting.  Genitourinary: Negative for dysuria  and flank pain.  Musculoskeletal: Negative for arthralgias, back pain, gait problem and neck pain.  Skin: Negative for color change.  Allergic/Immunologic: Negative for environmental allergies and food allergies.  Neurological: Negative for dizziness and headaches.  Hematological: Does not bruise/bleed easily.  Psychiatric/Behavioral: Negative for agitation, behavioral problems (depression) and hallucinations.    Vital Signs: BP 118/70   Pulse 83   Temp (!) 97.3 F (36.3 C)   Resp 16   Ht 5\' 4"  (1.626 m)   Wt 192 lb 3.2 oz (87.2 kg)   SpO2 98%   BMI 32.99 kg/m    Physical Exam Vitals reviewed.  Constitutional:      Appearance: Normal appearance.  Cardiovascular:     Rate and Rhythm: Normal rate and regular rhythm.     Pulses: Normal pulses.     Heart sounds: Normal heart sounds.  Pulmonary:     Effort: Pulmonary effort is normal.     Breath sounds: Normal breath sounds.  Abdominal:     General: Abdomen is flat.     Palpations: Abdomen is soft.  Musculoskeletal:        General: Normal range of motion.     Cervical back: Normal range of motion.  Skin:    General: Skin is warm.  Neurological:     General: No focal deficit present.     Mental Status: She is alert and oriented to person, place, and time. Mental status is at baseline.  Psychiatric:        Mood and Affect: Mood normal.        Behavior: Behavior normal.        Thought Content: Thought content normal.        Judgment: Judgment normal.    Assessment/Plan: 1. Prediabetes A1C well controlled at 5.6 Continues with Rybelsus - POCT HgB A1C  2. BMI 32.0-32.9,adult Continue with Rybelsus Comorbid conditions include prediabetes and HTN Obesity Counseling: Risk Assessment: An assessment of behavioral risk factors was made today and includes lack of exercise sedentary lifestyle, lack of portion control and poor dietary habits.  Risk Modification Advice: She was counseled on portion control guidelines.  Restricting daily caloric intake to 1800. The detrimental long term effects of obesity on her health and ongoing poor compliance was also discussed with the patient.  3. Secondary erythrocytosis Hgb and Hct remain stable, closely monitor  4. Essential hypertension BP and HR well controlled, continue to monitor  General Counseling: terris bodin understanding of the findings of todays visit and agrees with plan of treatment. I have discussed any further diagnostic evaluation that Bussa be needed or ordered today. We also reviewed her medications today. she has been encouraged to call the office with any questions or concerns that should arise related to todays visit.    Orders Placed This Encounter  Procedures  . POCT HgB A1C    Time spent: 30 Minutes Time spent includes review of chart, medications, test results and follow-up plan with the patient.  This patient was seen by  Theodoro Grist AGNP-C in Collaboration with Dr Lavera Guise as a part of collaborative care agreement     Tanna Furry. Azure Barrales AGNP-C Internal medicine

## 2020-12-04 ENCOUNTER — Encounter: Payer: Self-pay | Admitting: Hospice and Palliative Medicine

## 2021-02-26 ENCOUNTER — Other Ambulatory Visit: Payer: Self-pay | Admitting: Nurse Practitioner

## 2021-02-26 DIAGNOSIS — Z8673 Personal history of transient ischemic attack (TIA), and cerebral infarction without residual deficits: Secondary | ICD-10-CM

## 2021-02-26 DIAGNOSIS — I10 Essential (primary) hypertension: Secondary | ICD-10-CM

## 2021-02-26 DIAGNOSIS — E782 Mixed hyperlipidemia: Secondary | ICD-10-CM

## 2021-03-03 ENCOUNTER — Encounter: Payer: Self-pay | Admitting: Nurse Practitioner

## 2021-03-03 ENCOUNTER — Ambulatory Visit: Payer: 59 | Admitting: Nurse Practitioner

## 2021-03-03 ENCOUNTER — Other Ambulatory Visit: Payer: Self-pay

## 2021-03-03 VITALS — BP 131/85 | HR 67 | Temp 98.3°F | Resp 16 | Ht 64.0 in | Wt 192.6 lb

## 2021-03-03 DIAGNOSIS — I10 Essential (primary) hypertension: Secondary | ICD-10-CM

## 2021-03-03 DIAGNOSIS — Z13820 Encounter for screening for osteoporosis: Secondary | ICD-10-CM

## 2021-03-03 DIAGNOSIS — E782 Mixed hyperlipidemia: Secondary | ICD-10-CM | POA: Diagnosis not present

## 2021-03-03 DIAGNOSIS — Z8673 Personal history of transient ischemic attack (TIA), and cerebral infarction without residual deficits: Secondary | ICD-10-CM

## 2021-03-03 DIAGNOSIS — Z1231 Encounter for screening mammogram for malignant neoplasm of breast: Secondary | ICD-10-CM

## 2021-03-03 DIAGNOSIS — R7303 Prediabetes: Secondary | ICD-10-CM

## 2021-03-03 DIAGNOSIS — Z6833 Body mass index (BMI) 33.0-33.9, adult: Secondary | ICD-10-CM

## 2021-03-03 LAB — POCT GLYCOSYLATED HEMOGLOBIN (HGB A1C): Hemoglobin A1C: 5.8 % — AB (ref 4.0–5.6)

## 2021-03-03 MED ORDER — AMLODIPINE BESYLATE 5 MG PO TABS: 5.0000 mg | ORAL_TABLET | Freq: Every day | ORAL | 1 refills | Status: DC

## 2021-03-03 MED ORDER — CLOPIDOGREL BISULFATE 75 MG PO TABS: 75.0000 mg | ORAL_TABLET | Freq: Every day | ORAL | 1 refills | Status: DC

## 2021-03-03 MED ORDER — BENAZEPRIL HCL 5 MG PO TABS: 5.0000 mg | ORAL_TABLET | Freq: Every day | ORAL | 1 refills | Status: DC

## 2021-03-03 MED ORDER — LOVASTATIN 40 MG PO TABS: 40.0000 mg | ORAL_TABLET | Freq: Every day | ORAL | 1 refills | Status: DC

## 2021-03-03 NOTE — Progress Notes (Signed)
[facility] [address] [place], [place] [address]  Internal MEDICINE  Office Visit Note  Patient Name: Valerie Thornton  [id number]  [id number]  Date of Service: 03/12/2021  Chief Complaint  Patient presents with   Follow-up    Med refill   Prediabetes   Hypertension    HPI [name] presents for a follow up visit for medication refills, prediabetes and hypertension. She also has a history of migraines, erythrocytosis and a TIA in 2015. Her sirgical history is significant for a tonsillectomy and adenoidectomy as a child. Her A1C is due to be checked today and it is 5.8, up from 5.6 in April 2022.  -no bone density scan on file for the patient, need to order scan to screen for osteoporosis. Mammogram due in September 2022.  -Pap with HPV cotest due in 2024.  - she needs updated echo and carotid to into progression of disease   Current Medication: Outpatient Encounter Medications as of 03/03/2021  Medication Sig   clotrimazole-betamethasone (LOTRISONE) cream Apply 1 application topically 2 (two) times daily.   triamcinolone (KENALOG) 0.025 % cream Apply 1 application topically 2 (two) times daily.   [DISCONTINUED] amLODipine (NORVASC) 5 MG tablet Take 1 tablet (5 mg total) by mouth daily.   [DISCONTINUED] benazepril (LOTENSIN) 5 MG tablet Take 1 tablet (5 mg total) by mouth daily.   [DISCONTINUED] clopidogrel (PLAVIX) 75 MG tablet Take 1 tablet (75 mg total) by mouth daily.   [DISCONTINUED] lovastatin (MEVACOR) 40 MG tablet Take 1 tablet (40 mg total) by mouth at bedtime.   amLODipine (NORVASC) 5 MG tablet Take 1 tablet (5 mg total) by mouth daily.   benazepril (LOTENSIN) 5 MG tablet Take 1 tablet (5 mg total) by mouth daily.   clopidogrel (PLAVIX) 75 MG tablet Take 1 tablet (75 mg total) by mouth daily.   lovastatin (MEVACOR) 40 MG tablet Take 1 tablet (40 mg total) by mouth at bedtime.   [DISCONTINUED] RYBELSUS 7 MG TABS TAKE 1 TABLET BY MOUTH EVERY DAY (Patient not taking:  Reported on 03/03/2021)   No facility-administered encounter medications on file as of 03/03/2021.    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, hardly anymore she has one   TIA (transient ischemic attack) 2015   affected right side but completely resolved    Family History: Family History  Problem Relation Age of Onset   Breast cancer Maternal Aunt 28   Hypertension Mother    Hypertension Sister     Social History   Socioeconomic History   Marital status: Married    Spouse name: Not on file   Number of children: Not on file   Years of education: Not on file   Highest education level: Not on file  Occupational History   Not on file  Tobacco Use   Smoking status: Never   Smokeless tobacco: Never  Substance and Sexual Activity   Alcohol use: Yes    Comment: few times a month    Drug use: No   Sexual activity: Not on file  Other Topics Concern   Not on file  Social History Narrative   Not on file   Social Determinants of Health   Financial Resource  Strain: Not on file  Food Insecurity: Not on file  Transportation Needs: Not on file  Physical Activity: Not on file  Stress: Not on file  Social Connections: Not on file  Intimate Partner Violence: Not on file      Review of Systems  Constitutional:  Negative for chills, fatigue and unexpected weight change.  HENT:  Negative for congestion, rhinorrhea, sneezing and sore throat.   Eyes:  Negative for redness.  Respiratory:  Negative for cough, chest tightness and shortness of breath.   Cardiovascular:  Negative for chest pain and palpitations.  Gastrointestinal:  Negative for abdominal pain, constipation, diarrhea, nausea and vomiting.  Genitourinary:  Negative for dysuria and  frequency.  Musculoskeletal:  Negative for arthralgias, back pain, joint swelling and neck pain.  Skin:  Negative for rash.  Neurological: Negative.  Negative for tremors and numbness.  Hematological:  Negative for adenopathy. Does not bruise/bleed easily.  Psychiatric/Behavioral:  Negative for behavioral problems (Depression), sleep disturbance and suicidal ideas. The patient is not nervous/anxious.    Vital Signs: BP 131/85   Pulse 67   Temp 98.3 F (36.8 C)   Resp 16   Ht 5\' 4"  (1.626 m)   Wt 192 lb 9.6 oz (87.4 kg)   SpO2 96%   BMI 33.06 kg/m    Physical Exam Vitals reviewed.  Constitutional:      General: She is not in acute distress.    Appearance: Normal appearance. She is obese. She is not ill-appearing.  HENT:     Head: Normocephalic and atraumatic.  Cardiovascular:     Rate and Rhythm: Normal rate and regular rhythm.     Pulses: Normal pulses.     Heart sounds: No murmur heard. Pulmonary:     Effort: Pulmonary effort is normal. No respiratory distress.     Breath sounds: Normal breath sounds. No wheezing.  Skin:    General: Skin is warm and dry.     Capillary Refill: Capillary refill takes less than 2 seconds.  Neurological:     Mental Status: She is alert and oriented to person, place, and time.     Assessment/Plan: 1. Prediabetes A1C is 5.8 today. She has stopped taking Rybelsus and wants to continue to manage her prediabetes with diet modification and lifestyle modifications.  - POCT glycosylated hemoglobin (Hb A1C)  2. History of TIAs Clopidogrel for maintenance preventive treatment for TIA/stroke.  - clopidogrel (PLAVIX) 75 MG tablet; Take 1 tablet (75 mg total) by mouth daily.  Dispense: 90 tablet; Refill: 1  3. Essential hypertension Stable with current medications, refills ordered.  - amLODipine (NORVASC) 5 MG tablet; Take 1 tablet (5 mg total) by mouth daily.  Dispense: 90 tablet; Refill: 1 - benazepril (LOTENSIN) 5 MG tablet; Take 1 tablet (5  mg total) by mouth daily.  Dispense: 90 tablet; Refill: 1  4. Mixed hyperlipidemia Currently taking lovastatin. No changes.  - lovastatin (MEVACOR) 40 MG tablet; Take 1 tablet (40 mg total) by mouth at bedtime.  Dispense: 90 tablet; Refill: 1  5. Body mass index (BMI) of 33.0 to 33.9 in adult Alyzza's BMI is 33.06 and weight is 192 lbs. She is working on her diet modifications to model an appropriate diabetic diet.   6. Screening for osteoporosis Routine bone density scann ordered.  - DG Bone Density; Future  7. Encounter for screening mammogram for malignant neoplasm of breast Routine screening mammogram ordered placed.  - MM Digital Screening; Future   General Counseling: avalie oconnor understanding  of the findings of todays visit and agrees with plan of treatment. I have discussed any further diagnostic evaluation that Steller be needed or ordered today. We also reviewed her medications today. she has been encouraged to call the office with any questions or concerns that should arise related to todays visit.    Orders Placed This Encounter  Procedures   DG Bone Density   MM Digital Screening   POCT glycosylated hemoglobin (Hb A1C)   ECHOCARDIOGRAM COMPLETE   CAROTID ULTRASOUND (BACK OFFICE)    Meds ordered this encounter  Medications   clopidogrel (PLAVIX) 75 MG tablet    Sig: Take 1 tablet (75 mg total) by mouth daily.    Dispense:  90 tablet    Refill:  1   amLODipine (NORVASC) 5 MG tablet    Sig: Take 1 tablet (5 mg total) by mouth daily.    Dispense:  90 tablet    Refill:  1   benazepril (LOTENSIN) 5 MG tablet    Sig: Take 1 tablet (5 mg total) by mouth daily.    Dispense:  90 tablet    Refill:  1   lovastatin (MEVACOR) 40 MG tablet    Sig: Take 1 tablet (40 mg total) by mouth at bedtime.    Dispense:  90 tablet    Refill:  1    Return in 2 months (on 05/04/2021) for CPE previously scheduled in december 2022.   Total time spent:30 Minutes Time spent includes  review of chart, medications, test results, and follow up plan with the patient.   [place] Controlled Substance Database was reviewed by me.  This patient was seen by Sallyanne Kuster, FNP-C in collaboration with Dr. Beverely Risen as a part of collaborative care agreement.   Justen Fonda R. Tedd Sias, MSN, FNP-C Internal medicine

## 2021-03-09 LAB — HM DEXA SCAN

## 2021-03-10 ENCOUNTER — Encounter: Payer: Self-pay | Admitting: Nurse Practitioner

## 2021-03-10 ENCOUNTER — Other Ambulatory Visit: Payer: Self-pay | Admitting: Nurse Practitioner

## 2021-03-10 ENCOUNTER — Other Ambulatory Visit: Payer: Self-pay | Admitting: Internal Medicine

## 2021-03-10 MED ORDER — TRAMADOL HCL 50 MG PO TABS: ORAL_TABLET | ORAL | 0 refills | Status: DC

## 2021-03-10 MED ORDER — MELOXICAM 15 MG PO TABS: ORAL_TABLET | ORAL | 0 refills | Status: DC

## 2021-03-12 ENCOUNTER — Telehealth: Payer: Self-pay

## 2021-03-12 NOTE — Telephone Encounter (Signed)
Called pt and informed her of the testing [facility] wants to order (echo, carotid) and pt stated that she is ok with you ordering them.  I told pt that once they are ordered that she will get a call to schedule test.

## 2021-04-01 ENCOUNTER — Telehealth: Payer: 59 | Admitting: Nurse Practitioner

## 2021-04-01 ENCOUNTER — Other Ambulatory Visit: Payer: Self-pay

## 2021-04-01 ENCOUNTER — Encounter: Payer: Self-pay | Admitting: Nurse Practitioner

## 2021-04-01 VITALS — Ht 64.0 in | Wt 192.0 lb

## 2021-04-01 DIAGNOSIS — J329 Chronic sinusitis, unspecified: Secondary | ICD-10-CM

## 2021-04-01 DIAGNOSIS — R059 Cough, unspecified: Secondary | ICD-10-CM

## 2021-04-01 MED ORDER — AMOXICILLIN-POT CLAVULANATE 875-125 MG PO TABS: 1.0000 | ORAL_TABLET | Freq: Two times a day (BID) | ORAL | 0 refills | Status: DC

## 2021-04-01 NOTE — Progress Notes (Signed)
[facility] [address] [place], [place] [address]  Internal MEDICINE  Telephone Visit  Patient Name: Valerie Thornton  [id number]  [id number]  Date of Service: 04/01/2021  I connected with the patient at 1:07 PM by telephone and verified the patients identity using two identifiers.   I discussed the limitations, risks, security and privacy concerns of performing an evaluation and management service by telephone and the availability of in person appointments. I also discussed with the patient that there [place] be a patient responsible charge related to the service.  The patient expressed understanding and agrees to proceed.    Chief Complaint  Patient presents with   Telephone Assessment    Coughing and head and chest congestion, sneezing, started on Tuesday evening, runny nose but mucus has been clear       Telephone Screen    [phone]  virtual     HPI Valerie Thornton presents for a telehealth virtual visit for head and chest congestion. She also reports a cough with clear sputum production, runny nose and sneezing. She denies having any seasonal allergies or perennial allergies. She denies fever, chills, lack of appetite, body aches, sinus pain/pressure, or wheezing. She reports chest tightness, and intermittent shortness of breath.     Current Medication: Outpatient Encounter Medications as of 04/01/2021  Medication Sig   amLODipine (NORVASC) 5 MG tablet Take 1 tablet (5 mg total) by mouth daily.   amoxicillin-clavulanate (AUGMENTIN) 875-125 MG tablet Take 1 tablet by mouth 2 (two) times daily.   benazepril (LOTENSIN) 5 MG tablet Take 1 tablet (5 mg total) by mouth daily.   clopidogrel (PLAVIX) 75 MG tablet Take 1 tablet (75 mg total) by mouth daily.   clotrimazole-betamethasone (LOTRISONE) cream Apply 1 application topically 2 (two) times daily.   lovastatin (MEVACOR) 40 MG tablet Take 1 tablet (40 mg total) by mouth at bedtime.   meloxicam (MOBIC) 15 MG tablet Take one tab a day  with food for 7 days and then as needed   traMADol (ULTRAM) 50 MG tablet Take one ta po bid for hip pain for 7 days and then as needed   triamcinolone (KENALOG) 0.025 % cream Apply 1 application topically 2 (two) times daily.   No facility-administered encounter medications on file as of 04/01/2021.    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, hardly anymore she has one   TIA (transient ischemic attack) 2015   affected right side but completely resolved    Family History: Family History  Problem Relation Age of Onset   Breast cancer Maternal Aunt 61   Hypertension Mother    Hypertension Sister     Social History   Socioeconomic History   Marital status: Married    Spouse name: Not on file   Number of children: Not on file   Years of education: Not on file   Highest education level: Not on file  Occupational History   Not on file  Tobacco Use   Smoking status: Never   Smokeless tobacco: Never  Substance and Sexual Activity   Alcohol use: Yes    Comment: few times a month    Drug use: No   Sexual activity: Not on file  Other Topics Concern   Not on file  Social History Narrative   Not on file   Social Determinants of Health   Financial Resource Strain: Not on file  Food Insecurity: Not on file  Transportation Needs: Not on file  Physical Activity: Not on file  Stress: Not on file  Social Connections: Not on file  Intimate Partner Violence: Not on file      Review of Systems  Constitutional:  Negative for chills, fatigue and fever.  HENT:  Positive for congestion, rhinorrhea, sneezing and sore throat. Negative for sinus pressure and sinus pain.   Respiratory:  Positive for cough, chest tightness and shortness of breath  (intermittent). Negative for wheezing.   Neurological:  Positive for headaches.   Vital Signs: Ht 5\' 4"  (1.626 m)   Wt 192 lb (87.1 kg)   BMI 32.96 kg/m    Observation/Objective: She is alert and oriented, engages in conversation appropriately. She does not appear to be in any acute distress at this time.   Assessment/Plan: 1. Recurrent rhinosinusitis Recurrent and persistent symptoms, 5 day course of augmentin prescribed. - amoxicillin-clavulanate (AUGMENTIN) 875-125 MG tablet; Take 1 tablet by mouth 2 (two) times daily.  Dispense: 10 tablet; Refill: 0  2. Cough Hollander use OTC cough medication of patient's choice for symptomatic treatment.   General Counseling: kennley schwandt understanding of the findings of today's phone visit and agrees with plan of treatment. I have discussed any further diagnostic evaluation that Luthi be needed or ordered today. We also reviewed her medications today. she has been encouraged to call the office with any questions or concerns that should arise related to todays visit.    No orders of the defined types were placed in this encounter.   Meds ordered this encounter  Medications   amoxicillin-clavulanate (AUGMENTIN) 875-125 MG tablet    Sig: Take 1 tablet by mouth 2 (two) times daily.    Dispense:  10 tablet    Refill:  0   Return if symptoms worsen or fail to improve.   Time spent:20 Minutes  This patient was seen by Kathyrn Lass, FNP-C in collaboration with Dr. Sallyanne Kuster as a part of collaborative care agreement.   Dowell Hoon R. Beverely Risen, MSN, FNP-C Internal medicine

## 2021-04-13 ENCOUNTER — Other Ambulatory Visit: Payer: Self-pay | Admitting: Internal Medicine

## 2021-04-18 ENCOUNTER — Encounter: Payer: Self-pay | Admitting: Internal Medicine

## 2021-04-18 DIAGNOSIS — R5383 Other fatigue: Secondary | ICD-10-CM | POA: Insufficient documentation

## 2021-04-18 DIAGNOSIS — Z8669 Personal history of other diseases of the nervous system and sense organs: Secondary | ICD-10-CM | POA: Insufficient documentation

## 2021-04-18 DIAGNOSIS — Z1389 Encounter for screening for other disorder: Secondary | ICD-10-CM | POA: Insufficient documentation

## 2021-04-18 DIAGNOSIS — Z23 Encounter for immunization: Secondary | ICD-10-CM | POA: Insufficient documentation

## 2021-04-18 DIAGNOSIS — Z1239 Encounter for other screening for malignant neoplasm of breast: Secondary | ICD-10-CM | POA: Insufficient documentation

## 2021-04-18 DIAGNOSIS — Z01419 Encounter for gynecological examination (general) (routine) without abnormal findings: Secondary | ICD-10-CM | POA: Insufficient documentation

## 2021-04-18 DIAGNOSIS — Z1211 Encounter for screening for malignant neoplasm of colon: Secondary | ICD-10-CM | POA: Insufficient documentation

## 2021-04-18 DIAGNOSIS — Z87442 Personal history of urinary calculi: Secondary | ICD-10-CM | POA: Insufficient documentation

## 2021-04-18 DIAGNOSIS — R6889 Other general symptoms and signs: Secondary | ICD-10-CM | POA: Insufficient documentation

## 2021-04-18 DIAGNOSIS — Z3009 Encounter for other general counseling and advice on contraception: Secondary | ICD-10-CM | POA: Insufficient documentation

## 2021-05-22 ENCOUNTER — Other Ambulatory Visit: Payer: Self-pay | Admitting: Nurse Practitioner

## 2021-05-22 DIAGNOSIS — R7303 Prediabetes: Secondary | ICD-10-CM

## 2021-07-08 ENCOUNTER — Ambulatory Visit
Admission: RE | Admit: 2021-07-08 | Discharge: 2021-07-08 | Disposition: A | Discharge status: Home / Self Care | Payer: 59 | Source: Ambulatory Visit | Attending: Nurse Practitioner | Admitting: Nurse Practitioner

## 2021-07-08 ENCOUNTER — Other Ambulatory Visit: Payer: Self-pay

## 2021-07-08 DIAGNOSIS — Z1231 Encounter for screening mammogram for malignant neoplasm of breast: Secondary | ICD-10-CM | POA: Insufficient documentation

## 2021-08-04 ENCOUNTER — Other Ambulatory Visit: Payer: Self-pay

## 2021-08-04 ENCOUNTER — Ambulatory Visit (INDEPENDENT_AMBULATORY_CARE_PROVIDER_SITE_OTHER): Payer: 59 | Admitting: Nurse Practitioner

## 2021-08-04 ENCOUNTER — Encounter: Payer: Self-pay | Admitting: Nurse Practitioner

## 2021-08-04 VITALS — BP 125/76 | HR 64 | Temp 98.4°F | Resp 16 | Ht 64.0 in | Wt 190.8 lb

## 2021-08-04 DIAGNOSIS — B372 Candidiasis of skin and nail: Secondary | ICD-10-CM

## 2021-08-04 DIAGNOSIS — Z0001 Encounter for general adult medical examination with abnormal findings: Secondary | ICD-10-CM

## 2021-08-04 DIAGNOSIS — I10 Essential (primary) hypertension: Secondary | ICD-10-CM

## 2021-08-04 DIAGNOSIS — E782 Mixed hyperlipidemia: Secondary | ICD-10-CM

## 2021-08-04 DIAGNOSIS — Z8673 Personal history of transient ischemic attack (TIA), and cerebral infarction without residual deficits: Secondary | ICD-10-CM

## 2021-08-04 DIAGNOSIS — R300 Dysuria: Secondary | ICD-10-CM

## 2021-08-04 DIAGNOSIS — R7303 Prediabetes: Secondary | ICD-10-CM | POA: Diagnosis not present

## 2021-08-04 LAB — POCT GLYCOSYLATED HEMOGLOBIN (HGB A1C): Hemoglobin A1C: 6 % — AB (ref 4.0–5.6)

## 2021-08-04 MED ORDER — CLOTRIMAZOLE-BETAMETHASONE 1-0.05 % EX CREA: 1.0000 "application " | TOPICAL_CREAM | Freq: Two times a day (BID) | CUTANEOUS | 2 refills | Status: AC

## 2021-08-04 MED ORDER — LOVASTATIN 40 MG PO TABS: 40.0000 mg | ORAL_TABLET | Freq: Every day | ORAL | 1 refills | Status: DC

## 2021-08-04 MED ORDER — BENAZEPRIL HCL 5 MG PO TABS: 5.0000 mg | ORAL_TABLET | Freq: Every day | ORAL | 1 refills | Status: DC

## 2021-08-04 MED ORDER — CLOPIDOGREL BISULFATE 75 MG PO TABS: 75.0000 mg | ORAL_TABLET | Freq: Every day | ORAL | 1 refills | Status: DC

## 2021-08-04 MED ORDER — AMLODIPINE BESYLATE 5 MG PO TABS: 5.0000 mg | ORAL_TABLET | Freq: Every day | ORAL | 1 refills | Status: DC

## 2021-08-04 NOTE — Progress Notes (Signed)
[facility] [address] [place], [place] [address]  Internal MEDICINE  Office Visit Note  Patient Name: Valerie Thornton  [id number]  [id number]  Date of Service: 08/04/2021  Chief Complaint  Patient presents with   Annual Exam   Hypertension   Medication Refill    HPI Valerie Thornton presents for an annual well visit and physical exam.  She is a well-appearing 65 year old female with hypertension, prediabetes, and hyperlipidemia.  Her A1c was 6.0 today which remained stable and he continues to be prediabetic, her previous A1c was 5.8 in July.  Her blood pressure remains well controlled with current medications which are amlodipine, and benazepril.  For hyperlipidemia, she is taking lovastatin 40 mg daily.  She did have 1 virtual visit in between today's visit and her previous in person visit in July and it was a virtual visit for sinusitis.  Those symptoms have since resolved.  At her previous in person office visit, a bone density scan and routine mammogram were ordered.  Her bone density scan was normal and her mammogram was BI-RADS Category 1 negative.  She also has a history of TIAs and is on Plavix.  She does have some chronic joint pains related to osteoarthritis she takes Mobic daily and has tramadol as needed.  She is lost 2 pounds since her previous in person office visit.  She has no new or worsening pains.  She has no other questions or concerns today.   Current Medication: Outpatient Encounter Medications as of 08/04/2021  Medication Sig   meloxicam (MOBIC) 15 MG tablet TAKE ONE TAB A DAY WITH FOOD FOR 7 DAYS AND THEN AS NEEDED   traMADol (ULTRAM) 50 MG tablet Take one ta po bid for hip pain for 7 days and then as needed   triamcinolone (KENALOG) 0.025 % cream Apply 1 application topically 2 (two) times daily.   [DISCONTINUED] amLODipine (NORVASC) 5 MG tablet Take 1 tablet (5 mg total) by mouth daily.   [DISCONTINUED] amoxicillin-clavulanate (AUGMENTIN) 875-125 MG tablet Take 1  tablet by mouth 2 (two) times daily.   [DISCONTINUED] benazepril (LOTENSIN) 5 MG tablet Take 1 tablet (5 mg total) by mouth daily.   [DISCONTINUED] clopidogrel (PLAVIX) 75 MG tablet Take 1 tablet (75 mg total) by mouth daily.   [DISCONTINUED] clotrimazole-betamethasone (LOTRISONE) cream Apply 1 application topically 2 (two) times daily.   [DISCONTINUED] lovastatin (MEVACOR) 40 MG tablet Take 1 tablet (40 mg total) by mouth at bedtime.   amLODipine (NORVASC) 5 MG tablet Take 1 tablet (5 mg total) by mouth daily.   benazepril (LOTENSIN) 5 MG tablet Take 1 tablet (5 mg total) by mouth daily.   clopidogrel (PLAVIX) 75 MG tablet Take 1 tablet (75 mg total) by mouth daily.   clotrimazole-betamethasone (LOTRISONE) cream Apply 1 application topically 2 (two) times daily.   lovastatin (MEVACOR) 40 MG tablet Take 1 tablet (40 mg total) by mouth at bedtime.   No facility-administered encounter medications on file as of 08/04/2021.    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, hardly anymore she has one   TIA (transient ischemic attack) 2015   affected right side but completely resolved    Family History: Family History  Problem Relation  Age of Onset   Breast cancer Maternal Aunt [id number]   Hypertension Mother    Hypertension Sister     Social History   Socioeconomic History   Marital status: Married    Spouse name: Not on file   Number of children: Not on file   Years of education: Not on file   Highest education level: Not on file  Occupational History   Not on file  Tobacco Use   Smoking status: Never   Smokeless tobacco: Never  Substance and Sexual Activity   Alcohol use: Yes    Comment: few times a month    Drug use: No   Sexual activity: Not on  file  Other Topics Concern   Not on file  Social History Narrative   Not on file   Social Determinants of Health   Financial Resource Strain: Not on file  Food Insecurity: Not on file  Transportation Needs: Not on file  Physical Activity: Not on file  Stress: Not on file  Social Connections: Not on file  Intimate Partner Violence: Not on file      Review of Systems  Constitutional:  Negative for activity change, appetite change, chills, fatigue, fever and unexpected weight change.  HENT: Negative.  Negative for congestion, ear pain, rhinorrhea, sore throat and trouble swallowing.   Eyes: Negative.   Respiratory: Negative.  Negative for cough, chest tightness, shortness of breath and wheezing.   Cardiovascular: Negative.  Negative for chest pain.  Gastrointestinal: Negative.  Negative for abdominal pain, blood in stool, constipation, diarrhea, nausea and vomiting.  Endocrine: Negative.   Genitourinary: Negative.  Negative for difficulty urinating, dysuria, frequency, hematuria and urgency.  Musculoskeletal: Negative.  Negative for arthralgias, back pain, joint swelling, myalgias and neck pain.  Skin: Negative.  Negative for rash and wound.  Allergic/Immunologic: Negative.  Negative for immunocompromised state.  Neurological: Negative.  Negative for dizziness, seizures, numbness and headaches.  Hematological: Negative.   Psychiatric/Behavioral: Negative.  Negative for behavioral problems, self-injury and suicidal ideas. The patient is not nervous/anxious.    Vital Signs: BP 125/76   Pulse 64   Temp 98.4 F (36.9 C)   Resp 16   Ht 5\' 4"  (1.626 m)   Wt 190 lb 12.8 oz (86.5 kg)   SpO2 96%   BMI 32.75 kg/m    Physical Exam Vitals reviewed.  Constitutional:      General: She is awake. She is not in acute distress.    Appearance: Normal appearance. She is well-developed and well-groomed. She is obese. She is not ill-appearing or diaphoretic.  HENT:     Head:  Normocephalic and atraumatic.     Right Ear: Tympanic membrane, ear canal and external ear normal.     Left Ear: Tympanic membrane, ear canal and external ear normal.     Nose: Nose normal. No congestion or rhinorrhea.     Mouth/Throat:     Lips: Pink.     Mouth: Mucous membranes are moist.     Pharynx: Oropharynx is clear. Uvula midline. No oropharyngeal exudate or posterior oropharyngeal erythema.  Eyes:     General: Lids are normal. Vision grossly intact. Gaze aligned appropriately. No scleral icterus.       Right eye: No discharge.        Left eye: No discharge.     Extraocular Movements: Extraocular movements intact.     Conjunctiva/sclera: Conjunctivae normal.     Pupils: Pupils are equal, round, and reactive to light.  Funduscopic exam:    Right eye: Red reflex present.        Left eye: Red reflex present. Neck:     Thyroid: No thyromegaly.     Vascular: No JVD.     Trachea: Trachea and phonation normal. No tracheal deviation.  Cardiovascular:     Rate and Rhythm: Normal rate and regular rhythm.     Heart sounds: Normal heart sounds, S1 normal and S2 normal. No murmur heard.   No friction rub. No gallop.  Pulmonary:     Effort: Pulmonary effort is normal. No accessory muscle usage or respiratory distress.     Breath sounds: Normal breath sounds and air entry. No stridor. No wheezing or rales.  Chest:     Chest wall: No tenderness.     Comments: Declined clinical breast exam, gets annual mammograms, most recent one was normal. Abdominal:     General: Bowel sounds are normal. There is no distension.     Palpations: Abdomen is soft. There is no shifting dullness, fluid wave, mass or pulsatile mass.     Tenderness: There is no abdominal tenderness. There is no guarding or rebound.  Musculoskeletal:        General: No tenderness or deformity. Normal range of motion.     Cervical back: Normal range of motion and neck supple.     Right lower leg: No edema.     Left lower  leg: No edema.  Lymphadenopathy:     Cervical: No cervical adenopathy.  Skin:    General: Skin is warm and dry.     Coloration: Skin is not pale.     Findings: No erythema or rash.  Neurological:     Mental Status: She is alert and oriented to person, place, and time.     Cranial Nerves: No cranial nerve deficit.     Motor: No abnormal muscle tone.     Coordination: Coordination normal.     Gait: Gait normal.     Deep Tendon Reflexes: Reflexes are normal and symmetric.  Psychiatric:        Mood and Affect: Mood normal.        Behavior: Behavior normal. Behavior is cooperative.        Thought Content: Thought content normal.        Judgment: Judgment normal.       Assessment/Plan: 1. Encounter for general adult medical examination with abnormal findings Age-appropriate preventive screenings and vaccinations discussed, annual physical exam completed. Routine labs for health maintenance ordered and written lab slip provided to patient. PHM updated.   2. Prediabetes A1c was 6.0 today, remained stable, no medication necessary at this time, continue diet and lifestyle modifications. - POCT glycosylated hemoglobin (Hb A1C)  3. Essential hypertension Stable and well-controlled with current medications, refills ordered. - amLODipine (NORVASC) 5 MG tablet; Take 1 tablet (5 mg total) by mouth daily.  Dispense: 90 tablet; Refill: 1 - benazepril (LOTENSIN) 5 MG tablet; Take 1 tablet (5 mg total) by mouth daily.  Dispense: 90 tablet; Refill: 1  4. Mixed hyperlipidemia Written lab slip provided to patient for routine lipid panel, continue lovastatin as prescribed. - lovastatin (MEVACOR) 40 MG tablet; Take 1 tablet (40 mg total) by mouth at bedtime.  Dispense: 90 tablet; Refill: 1  5. Cutaneous candidiasis Patient keeps Lotrisone on hand for cutaneous yeast infections, refill ordered. - clotrimazole-betamethasone (LOTRISONE) cream; Apply 1 application topically 2 (two) times daily.   Dispense: 45 g; Refill: 2  6. History  of TIAs Patient has history of TIAs which increases her risk of having a possible stroke, patient takes Plavix for prevention of stroke, refill ordered. - clopidogrel (PLAVIX) 75 MG tablet; Take 1 tablet (75 mg total) by mouth daily.  Dispense: 90 tablet; Refill: 1  7. Dysuria Routine urinalysis done - UA/M w/rflx Culture, Routine      General Counseling: sabian sprow understanding of the findings of todays visit and agrees with plan of treatment. I have discussed any further diagnostic evaluation that Ey be needed or ordered today. We also reviewed her medications today. she has been encouraged to call the office with any questions or concerns that should arise related to todays visit.    Orders Placed This Encounter  Procedures   Microscopic Examination   UA/M w/rflx Culture, Routine   POCT glycosylated hemoglobin (Hb A1C)    Meds ordered this encounter  Medications   amLODipine (NORVASC) 5 MG tablet    Sig: Take 1 tablet (5 mg total) by mouth daily.    Dispense:  90 tablet    Refill:  1   lovastatin (MEVACOR) 40 MG tablet    Sig: Take 1 tablet (40 mg total) by mouth at bedtime.    Dispense:  90 tablet    Refill:  1   clopidogrel (PLAVIX) 75 MG tablet    Sig: Take 1 tablet (75 mg total) by mouth daily.    Dispense:  90 tablet    Refill:  1   benazepril (LOTENSIN) 5 MG tablet    Sig: Take 1 tablet (5 mg total) by mouth daily.    Dispense:  90 tablet    Refill:  1   clotrimazole-betamethasone (LOTRISONE) cream    Sig: Apply 1 application topically 2 (two) times daily.    Dispense:  45 g    Refill:  2    Return in about 6 months (around 02/02/2022) for F/U, med refill, Tempie Gibeault PCP.   Total time spent:30 Minutes Time spent includes review of chart, medications, test results, and follow up plan with the patient.   [place] Controlled Substance Database was reviewed by me.  This patient was seen by Jonetta Osgood, FNP-C in  collaboration with Dr. Clayborn Bigness as a part of collaborative care agreement.  Sire Poet R. Valetta Fuller, MSN, FNP-C Internal medicine

## 2021-08-05 LAB — MICROSCOPIC EXAMINATION
Bacteria, UA: NONE SEEN
Casts: NONE SEEN /lpf
RBC, Urine: NONE SEEN /hpf (ref 0–2)

## 2021-08-05 LAB — UA/M W/RFLX CULTURE, ROUTINE
Bilirubin, UA: NEGATIVE
Glucose, UA: NEGATIVE
Ketones, UA: NEGATIVE
Leukocytes,UA: NEGATIVE
Nitrite, UA: NEGATIVE
Protein,UA: NEGATIVE
RBC, UA: NEGATIVE
Specific Gravity, UA: 1.022 (ref 1.005–1.030)
Urobilinogen, Ur: 0.2 mg/dL (ref 0.2–1.0)
pH, UA: 5 (ref 5.0–7.5)

## 2021-08-12 ENCOUNTER — Other Ambulatory Visit: Payer: Self-pay

## 2021-08-12 ENCOUNTER — Ambulatory Visit
Admission: RE | Admit: 2021-08-12 | Discharge: 2021-08-12 | Disposition: A | Discharge status: Home / Self Care | Payer: 59 | Source: Ambulatory Visit | Attending: Nurse Practitioner | Admitting: Nurse Practitioner

## 2021-08-12 DIAGNOSIS — Z13820 Encounter for screening for osteoporosis: Secondary | ICD-10-CM | POA: Insufficient documentation

## 2021-08-13 NOTE — Progress Notes (Signed)
Bone density is normal

## 2021-08-19 ENCOUNTER — Telehealth: Payer: Self-pay

## 2021-08-19 NOTE — Telephone Encounter (Signed)
Spoke to pt to see if she is still taking rybelsus 7 mg and pt advised she is no longer taking medication

## 2021-08-30 ENCOUNTER — Other Ambulatory Visit: Payer: Self-pay | Admitting: Nurse Practitioner

## 2021-08-30 DIAGNOSIS — I10 Essential (primary) hypertension: Secondary | ICD-10-CM

## 2021-08-30 DIAGNOSIS — E782 Mixed hyperlipidemia: Secondary | ICD-10-CM

## 2021-08-30 DIAGNOSIS — Z8673 Personal history of transient ischemic attack (TIA), and cerebral infarction without residual deficits: Secondary | ICD-10-CM

## 2022-01-26 ENCOUNTER — Encounter: Payer: Self-pay | Admitting: Nurse Practitioner

## 2022-01-26 ENCOUNTER — Ambulatory Visit: Payer: 59 | Admitting: Nurse Practitioner

## 2022-01-26 VITALS — BP 127/74 | HR 71 | Temp 98.4°F | Resp 16 | Ht 64.0 in | Wt 198.6 lb

## 2022-01-26 DIAGNOSIS — E042 Nontoxic multinodular goiter: Secondary | ICD-10-CM | POA: Diagnosis not present

## 2022-01-26 DIAGNOSIS — I10 Essential (primary) hypertension: Secondary | ICD-10-CM | POA: Diagnosis not present

## 2022-01-26 DIAGNOSIS — E782 Mixed hyperlipidemia: Secondary | ICD-10-CM | POA: Diagnosis not present

## 2022-01-26 DIAGNOSIS — R7303 Prediabetes: Secondary | ICD-10-CM | POA: Diagnosis not present

## 2022-01-26 LAB — POCT GLYCOSYLATED HEMOGLOBIN (HGB A1C): Hemoglobin A1C: 5.9 % — AB (ref 4.0–5.6)

## 2022-01-26 MED ORDER — BENAZEPRIL HCL 5 MG PO TABS: 5.0000 mg | ORAL_TABLET | Freq: Every day | ORAL | 1 refills | Status: DC

## 2022-01-26 MED ORDER — AMLODIPINE BESYLATE 5 MG PO TABS: 5.0000 mg | ORAL_TABLET | Freq: Every day | ORAL | 1 refills | Status: DC

## 2022-01-26 NOTE — Progress Notes (Signed)
[facility] [address] [place], [place] [address]  Internal MEDICINE  Office Visit Note  Patient Name: Valerie Thornton  [id number]  [id number]  Date of Service: 01/26/2022  Chief Complaint  Patient presents with   Follow-up   Hypertension   Medication Refill    HPI Valerie Thornton presents for a follow up visit for hypertension, prediabetes and repeat labs and medication refills Her blood pressure is stable and controlled with current medication. Her A1C is slightly improved at 5.9 today, her previous a1c was 6.0.  Her annual wellness visit is due in november. She will need labs ordered.  Medication refills are due and will be sent today.    Current Medication: Outpatient Encounter Medications as of 01/26/2022  Medication Sig   clopidogrel (PLAVIX) 75 MG tablet TAKE 1 TABLET BY MOUTH EVERY DAY   clotrimazole-betamethasone (LOTRISONE) cream Apply 1 application topically 2 (two) times daily.   lovastatin (MEVACOR) 40 MG tablet TAKE 1 TABLET BY MOUTH EVERYDAY AT BEDTIME   meloxicam (MOBIC) 15 MG tablet TAKE ONE TAB A DAY WITH FOOD FOR 7 DAYS AND THEN AS NEEDED   triamcinolone (KENALOG) 0.025 % cream Apply 1 application topically 2 (two) times daily.   [DISCONTINUED] amLODipine (NORVASC) 5 MG tablet TAKE 1 TABLET (5 MG TOTAL) BY MOUTH DAILY.   [DISCONTINUED] benazepril (LOTENSIN) 5 MG tablet TAKE 1 TABLET (5 MG TOTAL) BY MOUTH DAILY.   [DISCONTINUED] traMADol (ULTRAM) 50 MG tablet Take one ta po bid for hip pain for 7 days and then as needed   amLODipine (NORVASC) 5 MG tablet Take 1 tablet (5 mg total) by mouth daily.   benazepril (LOTENSIN) 5 MG tablet Take 1 tablet (5 mg total) by mouth daily.   No facility-administered encounter medications on file as of 01/26/2022.    Surgical History: Past Surgical History:  Procedure Laterality Date   TONSILLECTOMY AND ADENOIDECTOMY     pt was 66 years old    Medical History: Past Medical History:  Diagnosis Date   Erythrocytosis     Hypertension    Migraines    per pt it was hormonal and food caused, hardly anymore she has one   TIA (transient ischemic attack) 2015   affected right side but completely resolved    Family History: Family History  Problem Relation Age of Onset   Breast cancer Maternal Aunt 66   Hypertension Mother    Hypertension Sister     Social History   Socioeconomic History   Marital status: Married    Spouse name: Not on file   Number of children: Not on file   Years of education: Not on file   Highest education level: Not on file  Occupational History   Not on file  Tobacco Use   Smoking status: Never   Smokeless tobacco: Never  Substance and Sexual Activity   Alcohol use: Yes    Comment: few times a month    Drug use: No   Sexual activity: Not on file  Other Topics Concern   Not on file  Social History Narrative   Not on file   Social Determinants of Health   Financial Resource Strain: Not on file  Food Insecurity: Not on file  Transportation Needs: Not on file  Physical Activity: Not on file  Stress: Not on file  Social Connections: Not on file  Intimate Partner Violence: Not on file      Review of Systems  Constitutional:  Negative for chills, fatigue and  unexpected weight change.  HENT:  Negative for congestion, rhinorrhea, sneezing and sore throat.   Eyes:  Negative for redness.  Respiratory:  Negative for cough, chest tightness and shortness of breath.   Cardiovascular:  Negative for chest pain and palpitations.  Gastrointestinal:  Negative for abdominal pain, constipation, diarrhea, nausea and vomiting.  Genitourinary:  Negative for dysuria and frequency.  Musculoskeletal:  Negative for arthralgias, back pain, joint swelling and neck pain.  Skin:  Negative for rash.  Neurological: Negative.  Negative for tremors and numbness.  Hematological:  Negative for adenopathy. Does not bruise/bleed easily.  Psychiatric/Behavioral:  Negative for behavioral problems  (Depression), sleep disturbance and suicidal ideas. The patient is not nervous/anxious.     Vital Signs: BP 127/74   Pulse 71   Temp 98.4 F (36.9 C)   Resp 16   Ht 5\' 4"  (1.626 m)   Wt 198 lb 9.6 oz (90.1 kg)   SpO2 98%   BMI 34.09 kg/m    Physical Exam Vitals reviewed.  Constitutional:      General: She is not in acute distress.    Appearance: Normal appearance. She is obese. She is not ill-appearing.  HENT:     Head: Normocephalic and atraumatic.  Eyes:     Pupils: Pupils are equal, round, and reactive to light.  Cardiovascular:     Rate and Rhythm: Normal rate and regular rhythm.  Pulmonary:     Effort: Pulmonary effort is normal. No respiratory distress.  Neurological:     Mental Status: She is alert and oriented to person, place, and time.  Psychiatric:        Mood and Affect: Mood normal.        Behavior: Behavior normal.        Assessment/Plan: 1. Essential hypertension Well controlled with current medication, refills ordered.  - amLODipine (NORVASC) 5 MG tablet; Take 1 tablet (5 mg total) by mouth daily.  Dispense: 90 tablet; Refill: 1 - benazepril (LOTENSIN) 5 MG tablet; Take 1 tablet (5 mg total) by mouth daily.  Dispense: 90 tablet; Refill: 1  2. Prediabetes Slightly improved. Will continue to monitor. She has been working on previously discussed diet and lifestyle modifications.  - POCT glycosylated hemoglobin (Hb A1C)  3. Multinodular goiter Patient provided with lab requisition form for [facility] to repeat thyroid labs  4. Mixed hyperlipidemia Lipid panel ordered on written lab slip, informed patient she can have labs done now or wait until her AWV later this year.    General Counseling: Valerie LassKaren verbalizes understanding of the findings of todays visit and agrees with plan of treatment. I have discussed any further diagnostic evaluation that [place] be needed or ordered today. We also reviewed her medications today. she has been encouraged to call the  office with any questions or concerns that should arise related to todays visit.    Orders Placed This Encounter  Procedures   POCT glycosylated hemoglobin (Hb A1C)    Meds ordered this encounter  Medications   amLODipine (NORVASC) 5 MG tablet    Sig: Take 1 tablet (5 mg total) by mouth daily.    Dispense:  90 tablet    Refill:  1    DX Code Needed  .   benazepril (LOTENSIN) 5 MG tablet    Sig: Take 1 tablet (5 mg total) by mouth daily.    Dispense:  90 tablet    Refill:  1    DX Code Needed  .    Return  for previously scheduled, CPE, Ellinor Test PCP in november.   Total time spent:30 Minutes Time spent includes review of chart, medications, test results, and follow up plan with the patient.   [place] Controlled Substance Database was reviewed by me.  This patient was seen by Sallyanne Kuster, FNP-C in collaboration with Dr. Beverely Risen as a part of collaborative care agreement.   Emine Lopata R. Tedd Sias, MSN, FNP-C Internal medicine

## 2022-01-31 ENCOUNTER — Other Ambulatory Visit: Payer: Self-pay | Admitting: Nurse Practitioner

## 2022-01-31 DIAGNOSIS — Z8673 Personal history of transient ischemic attack (TIA), and cerebral infarction without residual deficits: Secondary | ICD-10-CM

## 2022-01-31 DIAGNOSIS — E782 Mixed hyperlipidemia: Secondary | ICD-10-CM

## 2022-02-04 ENCOUNTER — Other Ambulatory Visit: Payer: Self-pay | Admitting: Nurse Practitioner

## 2022-02-05 LAB — COMPREHENSIVE METABOLIC PANEL
ALT: 20 IU/L (ref 0–32)
AST: 15 IU/L (ref 0–40)
Albumin/Globulin Ratio: 1.8 (ref 1.2–2.2)
Albumin: 4.3 g/dL (ref 3.8–4.8)
Alkaline Phosphatase: 93 IU/L (ref 44–121)
BUN/Creatinine Ratio: 16 (ref 12–28)
BUN: 14 mg/dL (ref 8–27)
Bilirubin Total: 0.6 mg/dL (ref 0.0–1.2)
CO2: 25 mmol/L (ref 20–29)
Calcium: 9.5 mg/dL (ref 8.7–10.3)
Chloride: 103 mmol/L (ref 96–106)
Creatinine, Ser: 0.85 mg/dL (ref 0.57–1.00)
Globulin, Total: 2.4 g/dL (ref 1.5–4.5)
Glucose: 101 mg/dL — ABNORMAL HIGH (ref 70–99)
Potassium: 4.6 mmol/L (ref 3.5–5.2)
Sodium: 143 mmol/L (ref 134–144)
Total Protein: 6.7 g/dL (ref 6.0–8.5)
eGFR: 76 mL/min/{1.73_m2} (ref >59)

## 2022-02-05 LAB — LIPID PANEL
Chol/HDL Ratio: 3 ratio (ref 0.0–4.4)
Cholesterol, Total: 210 mg/dL — ABNORMAL HIGH (ref 100–199)
HDL: 70 mg/dL (ref >39)
LDL Chol Calc (NIH): 116 mg/dL — ABNORMAL HIGH (ref 0–99)
Triglycerides: 137 mg/dL (ref 0–149)
VLDL Cholesterol Cal: 24 mg/dL (ref 5–40)

## 2022-02-05 LAB — CBC WITH DIFFERENTIAL/PLATELET
Basophils Absolute: 0 10*3/uL (ref 0.0–0.2)
Basos: 0 %
EOS (ABSOLUTE): 0.1 10*3/uL (ref 0.0–0.4)
Eos: 1 %
Hematocrit: 51.6 % — ABNORMAL HIGH (ref 34.0–46.6)
Hemoglobin: 17.6 g/dL — ABNORMAL HIGH (ref 11.1–15.9)
Immature Grans (Abs): 0 10*3/uL (ref 0.0–0.1)
Immature Granulocytes: 0 %
Lymphocytes Absolute: 2 10*3/uL (ref 0.7–3.1)
Lymphs: 19 %
MCH: 29.6 pg (ref 26.6–33.0)
MCHC: 34.1 g/dL (ref 31.5–35.7)
MCV: 87 fL (ref 79–97)
Monocytes Absolute: 0.7 10*3/uL (ref 0.1–0.9)
Monocytes: 7 %
Neutrophils Absolute: 7.7 10*3/uL — ABNORMAL HIGH (ref 1.4–7.0)
Neutrophils: 73 %
Platelets: 272 10*3/uL (ref 150–450)
RBC: 5.94 x10E6/uL — ABNORMAL HIGH (ref 3.77–5.28)
RDW: 13.4 % (ref 11.7–15.4)
WBC: 10.6 10*3/uL (ref 3.4–10.8)

## 2022-02-05 LAB — T4, FREE: Free T4: 1.34 ng/dL (ref 0.82–1.77)

## 2022-02-05 LAB — TSH: TSH: 0.815 u[IU]/mL (ref 0.450–4.500)

## 2022-02-05 LAB — HGB A1C W/O EAG: Hgb A1c MFr Bld: 6 % — ABNORMAL HIGH (ref 4.8–5.6)

## 2022-02-05 LAB — VITAMIN D 25 HYDROXY (VIT D DEFICIENCY, FRACTURES): Vit D, 25-Hydroxy: 36.5 ng/mL (ref 30.0–100.0)

## 2022-02-05 LAB — B12 AND FOLATE PANEL
Folate: >20 ng/mL (ref >3.0)
Vitamin B-12: 574 pg/mL (ref 232–1245)

## 2022-02-22 ENCOUNTER — Ambulatory Visit: Payer: 59 | Admitting: Internal Medicine

## 2022-02-28 ENCOUNTER — Encounter: Payer: Self-pay | Admitting: Nurse Practitioner

## 2022-02-28 ENCOUNTER — Encounter: Payer: Self-pay | Admitting: Internal Medicine

## 2022-02-28 ENCOUNTER — Ambulatory Visit: Payer: 59 | Admitting: Internal Medicine

## 2022-02-28 ENCOUNTER — Telehealth: Payer: Self-pay

## 2022-02-28 VITALS — BP 136/78 | HR 77 | Temp 98.5°F | Resp 16 | Ht 64.0 in | Wt 196.4 lb

## 2022-02-28 DIAGNOSIS — I10 Essential (primary) hypertension: Secondary | ICD-10-CM

## 2022-02-28 DIAGNOSIS — Z8673 Personal history of transient ischemic attack (TIA), and cerebral infarction without residual deficits: Secondary | ICD-10-CM | POA: Diagnosis not present

## 2022-02-28 DIAGNOSIS — G479 Sleep disorder, unspecified: Secondary | ICD-10-CM | POA: Diagnosis not present

## 2022-02-28 DIAGNOSIS — D751 Secondary polycythemia: Secondary | ICD-10-CM

## 2022-02-28 DIAGNOSIS — R7303 Prediabetes: Secondary | ICD-10-CM

## 2022-02-28 DIAGNOSIS — Z6834 Body mass index (BMI) 34.0-34.9, adult: Secondary | ICD-10-CM

## 2022-02-28 DIAGNOSIS — E782 Mixed hyperlipidemia: Secondary | ICD-10-CM

## 2022-02-28 NOTE — Progress Notes (Signed)
[facility] [address] [place], [place] [address]  Internal MEDICINE  Office Visit Note  Patient Name: Valerie Thornton  [id number]  [id number]  Date of Service: 02/28/2022  Chief Complaint  Patient presents with   Follow-up   Hypertension   Results    HPI Patient is here to have a follow-up on her routine labs. 1.  Hemoglobin is elevated to 18, Valerie Thornton does have a history of polycythemia in the past which was worked up by hematology however Valerie Thornton does not have a follow-up over 5 years with hematology Valerie Thornton is a non-smoker denies any headache shortness of breath. 2.  Patient also has mildly elevated cholesterol, Valerie Thornton is on Mevacor. 3.  Blood pressure is under good control Valerie Thornton is on Norvasc and Lotensin. 4.  Patient gives a history of TIA and has been maintained on Plavix since then. 5.  The patient has borderline elevated hemoglobin A 1C, in the prediabetic range 6.  Patient does gives history of excessive snoring increased fatigue during the day 7.  History of multinodular goiter followed by endocrinology recent ultrasound of thyroid has been normal and stable  Current Medication: Outpatient Encounter Medications as of 02/28/2022  Medication Sig   amLODipine (NORVASC) 5 MG tablet Take 1 tablet (5 mg total) by mouth daily.   benazepril (LOTENSIN) 5 MG tablet Take 1 tablet (5 mg total) by mouth daily.   clopidogrel (PLAVIX) 75 MG tablet TAKE 1 TABLET(75 MG) BY MOUTH DAILY   clotrimazole-betamethasone (LOTRISONE) cream Apply 1 application topically 2 (two) times daily.   lovastatin (MEVACOR) 40 MG tablet TAKE 1 TABLET(40 MG) BY MOUTH AT BEDTIME   meloxicam (MOBIC) 15 MG tablet TAKE ONE TAB A DAY WITH FOOD FOR 7 DAYS AND THEN AS NEEDED   triamcinolone (KENALOG) 0.025 % cream Apply 1 application topically 2 (two) times daily.   No facility-administered encounter medications on file as of 02/28/2022.    Surgical History: Past Surgical History:  Procedure Laterality Date    TONSILLECTOMY AND ADENOIDECTOMY     pt was 66 years old    Medical History: Past Medical History:  Diagnosis Date   Erythrocytosis    Hypertension    Migraines    per pt it was hormonal and food caused, hardly anymore Valerie Thornton has one   TIA (transient ischemic attack) 2015   affected right side but completely resolved    Family History: Family History  Problem Relation Age of Onset   Breast cancer Maternal Aunt 73   Hypertension Mother    Hypertension Sister     Social History   Socioeconomic History   Marital status: Married    Spouse name: Not on file   Number of children: Not on file   Years of education: Not on file   Highest education level: Not on file  Occupational History   Not on file  Tobacco Use   Smoking status: Never   Smokeless tobacco: Never  Substance and Sexual Activity   Alcohol use: Yes    Comment: few times a month    Drug use: No   Sexual activity: Not on file  Other Topics Concern   Not on file  Social History Narrative   Not on file   Social Determinants of Health   Financial Resource Strain: Not on file  Food Insecurity: Not on file  Transportation Needs: Not on file  Physical Activity: Not on file  Stress: Not on file  Social Connections: Not on file  Intimate  Partner Violence: Not on file      Review of Systems  Constitutional:  Positive for fatigue. Negative for fever.  HENT:  Negative for congestion, mouth sores and postnasal drip.   Respiratory:  Negative for cough.   Cardiovascular:  Negative for chest pain.  Gastrointestinal:  Negative for abdominal distention.  Endocrine: Negative for heat intolerance.  Genitourinary:  Negative for flank pain.  Neurological:  Negative for syncope.  Psychiatric/Behavioral:  Positive for sleep disturbance.     Vital Signs: BP 136/78   Pulse 77   Temp 98.5 F (36.9 C)   Resp 16   Ht 5\' 4"  (1.626 m)   Wt 196 lb 6.4 oz (89.1 kg)   SpO2 97%   BMI 33.71 kg/m    Physical  Exam Constitutional:      Appearance: Normal appearance.  HENT:     Head: Normocephalic and atraumatic.     Nose: Nose normal.     Mouth/Throat:     Mouth: Mucous membranes are moist.     Pharynx: No posterior oropharyngeal erythema.  Eyes:     Extraocular Movements: Extraocular movements intact.     Pupils: Pupils are equal, round, and reactive to light.  Cardiovascular:     Pulses: Normal pulses.     Heart sounds: Normal heart sounds.  Pulmonary:     Effort: Pulmonary effort is normal.     Breath sounds: Normal breath sounds.  Neurological:     General: No focal deficit present.     Mental Status: Valerie Thornton is alert.  Psychiatric:        Mood and Affect: Mood normal.        Behavior: Behavior normal.        Assessment/Plan: 1. Polycythemia Elevated hemoglobin of 17.8, will check iron levels and refer to hematology if needed - Fe+TIBC+Fer  2. Sleep disturbance On examination her Mallampati score is 4, history of problem maintaining her sleep excessive snoring and daytime fatigue warrants a sleep study.  Patient also has history of TIA, elevated BMI.  And hypertension - PSG Sleep Study; Future  3. BMI 34.0-34.9,adult Encouraged weight loss  4. H/O TIA (transient ischemic attack) and stroke Patient has history of TIA on Plavix will need to have updated carotid Dopplers and echo if needed  5. Essential hypertension Blood pressure is stable we will continue Norvasc and Lotensin  6. Prediabetes Patient is a prediabetic with hemoglobin A1c of 6 continue to encourage to do lifestyle changes  7. Mixed hyperlipidemia Patient is most likely need to be on a statin and will discuss on next visit  General Counseling: marayah higdon understanding of the findings of todays visit and agrees with plan of treatment. I have discussed any further diagnostic evaluation that [place] be needed or ordered today. We also reviewed her medications today. Valerie Thornton has been encouraged to call the office  with any questions or concerns that should arise related to todays visit.    Orders Placed This Encounter  Procedures   Fe+TIBC+Fer   PSG Sleep Study    No orders of the defined types were placed in this encounter.   Total time spent:35 Minutes Time spent includes review of chart, medications, test results, and follow up plan with the patient.   [place] Controlled Substance Database was reviewed by me.   Dr Kathyrn Lass Internal medicine

## 2022-02-28 NOTE — Telephone Encounter (Signed)
Awaiting 02/28/22 office notes for SS order-Toni

## 2022-03-02 NOTE — Telephone Encounter (Signed)
SS order placed in [facility] folder-Toni 

## 2022-03-04 LAB — IRON,TIBC AND FERRITIN PANEL
Ferritin: 110 ng/mL (ref 15–150)
Iron Saturation: 25 % (ref 15–55)
Iron: 81 ug/dL (ref 27–139)
Total Iron Binding Capacity: 321 ug/dL (ref 250–450)
UIBC: 240 ug/dL (ref 118–369)

## 2022-05-16 ENCOUNTER — Telehealth: Payer: Self-pay | Admitting: Internal Medicine

## 2022-05-16 NOTE — Telephone Encounter (Signed)
Faxed update request to Teresa with [facility]-Toni 

## 2022-05-27 ENCOUNTER — Other Ambulatory Visit: Payer: Self-pay | Admitting: Internal Medicine

## 2022-06-20 ENCOUNTER — Other Ambulatory Visit: Payer: Self-pay | Admitting: Nurse Practitioner

## 2022-06-20 DIAGNOSIS — Z1231 Encounter for screening mammogram for malignant neoplasm of breast: Secondary | ICD-10-CM

## 2022-07-19 ENCOUNTER — Encounter: Payer: 59 | Admitting: Nurse Practitioner

## 2022-07-26 ENCOUNTER — Telehealth: Payer: Self-pay | Admitting: Nurse Practitioner

## 2022-07-26 NOTE — Telephone Encounter (Signed)
Surgical clearance form will be completed during patient's 08/08/22 office visit-Toni

## 2022-07-26 NOTE — Telephone Encounter (Signed)
Received surgical clearance. Gave to Dfk for signature-Toni

## 2022-07-27 ENCOUNTER — Ambulatory Visit
Admission: RE | Admit: 2022-07-27 | Discharge: 2022-07-27 | Disposition: A | Discharge status: Home / Self Care | Payer: 59 | Source: Ambulatory Visit | Attending: Nurse Practitioner | Admitting: Nurse Practitioner

## 2022-07-27 DIAGNOSIS — Z1231 Encounter for screening mammogram for malignant neoplasm of breast: Secondary | ICD-10-CM | POA: Insufficient documentation

## 2022-08-01 ENCOUNTER — Other Ambulatory Visit: Payer: Self-pay | Admitting: Nurse Practitioner

## 2022-08-01 DIAGNOSIS — I10 Essential (primary) hypertension: Secondary | ICD-10-CM

## 2022-08-08 ENCOUNTER — Telehealth: Payer: Self-pay | Admitting: Nurse Practitioner

## 2022-08-08 ENCOUNTER — Encounter: Payer: Self-pay | Admitting: Nurse Practitioner

## 2022-08-08 ENCOUNTER — Ambulatory Visit (INDEPENDENT_AMBULATORY_CARE_PROVIDER_SITE_OTHER): Payer: 59 | Admitting: Nurse Practitioner

## 2022-08-08 VITALS — BP 143/77 | HR 76 | Temp 97.2°F | Resp 16 | Ht 64.0 in | Wt 199.0 lb

## 2022-08-08 DIAGNOSIS — E782 Mixed hyperlipidemia: Secondary | ICD-10-CM | POA: Diagnosis not present

## 2022-08-08 DIAGNOSIS — Z0001 Encounter for general adult medical examination with abnormal findings: Secondary | ICD-10-CM

## 2022-08-08 DIAGNOSIS — R300 Dysuria: Secondary | ICD-10-CM

## 2022-08-08 DIAGNOSIS — R7303 Prediabetes: Secondary | ICD-10-CM | POA: Diagnosis not present

## 2022-08-08 DIAGNOSIS — D751 Secondary polycythemia: Secondary | ICD-10-CM | POA: Diagnosis not present

## 2022-08-08 DIAGNOSIS — Z1212 Encounter for screening for malignant neoplasm of rectum: Secondary | ICD-10-CM

## 2022-08-08 DIAGNOSIS — Z1211 Encounter for screening for malignant neoplasm of colon: Secondary | ICD-10-CM

## 2022-08-08 NOTE — Progress Notes (Signed)
[facility] [address] [place], [place] [address]  Internal MEDICINE  Office Visit Note  Patient Name: Valerie Thornton  [id number]  [id number]  Date of Service: 08/08/2022  Chief Complaint  Patient presents with   Annual Exam   Hypertension    HPI Valerie Thornton presents for an annual well visit and physical exam.  Well-appearing 66 y.o. female with hypertension, goiter, high cholesterol, prediabetes and history of TIAs Routine CRC screening: last done 2020, due now Routine mammogram: done on 07/27/22 DEXA scan: done December last year Labs: due now  New or worsening pain: left shoulder, completed surgical clearance for upcoming surgical repair Other concerns: none     08/08/2022    2:23 PM  MMSE - Mini Mental State Exam  Orientation to time 5  Orientation to Place 5  Registration 3  Attention/ Calculation 5  Recall 3  Language- name 2 objects 2  Language- repeat 1  Language- follow 3 step command 3  Language- read & follow direction 1  Write a sentence 1  Copy design 1  Total score 30    Functional Status Survey: Is the patient deaf or have difficulty hearing?: No Does the patient have difficulty seeing, even when wearing glasses/contacts?: No Does the patient have difficulty concentrating, remembering, or making decisions?: No Does the patient have difficulty walking or climbing stairs?: No Does the patient have difficulty dressing or bathing?: No Does the patient have difficulty doing errands alone such as visiting a doctor's office or shopping?: No     12/02/2020   10:48 AM 03/03/2021    9:04 AM 08/04/2021   10:18 AM 01/26/2022    9:44 AM 08/08/2022    2:02 PM  Fall Risk  Falls in the past year? 0 0 0 0 0  Was there an injury with Fall?     0  Fall Risk Category Calculator     0  Fall Risk Category     Low  Patient Fall Risk Level   Low fall risk Low fall risk Low fall risk  Patient at Risk for Falls Due to No Fall Risks  No Fall Risks No Fall Risks No  Fall Risks  Fall risk Follow up Falls evaluation completed  Falls evaluation completed Falls evaluation completed Falls evaluation completed       08/08/2022    2:02 PM  Depression screen PHQ 2/9  Decreased Interest 0  Down, Depressed, Hopeless 0  PHQ - 2 Score 0        No data to display            Current Medication: Outpatient Encounter Medications as of 08/08/2022  Medication Sig   amLODipine (NORVASC) 5 MG tablet TAKE 1 TABLET(5 MG) BY MOUTH DAILY   benazepril (LOTENSIN) 5 MG tablet TAKE 1 TABLET(5 MG) BY MOUTH DAILY   clopidogrel (PLAVIX) 75 MG tablet TAKE 1 TABLET(75 MG) BY MOUTH DAILY   clotrimazole-betamethasone (LOTRISONE) cream Apply 1 application topically 2 (two) times daily.   lovastatin (MEVACOR) 40 MG tablet TAKE 1 TABLET(40 MG) BY MOUTH AT BEDTIME   meloxicam (MOBIC) 15 MG tablet TAKE 1 TABLET BY MOUTH EVERY DAY WITH FOOD FOR 7 DAYS THEN AS NEEDED   triamcinolone (KENALOG) 0.025 % cream Apply 1 application topically 2 (two) times daily.   No facility-administered encounter medications on file as of 08/08/2022.    Surgical History: Past Surgical History:  Procedure Laterality Date   TONSILLECTOMY AND ADENOIDECTOMY     pt was  66 years old    Medical History: Past Medical History:  Diagnosis Date   Erythrocytosis    Hypertension    Migraines    per pt it was hormonal and food caused, hardly anymore she has one   TIA (transient ischemic attack) 2015   affected right side but completely resolved    Family History: Family History  Problem Relation Age of Onset   Breast cancer Maternal Aunt 44   Hypertension Mother    Hypertension Sister     Social History   Socioeconomic History   Marital status: Married    Spouse name: Not on file   Number of children: Not on file   Years of education: Not on file   Highest education level: Not on file  Occupational History   Not on file  Tobacco Use   Smoking status: Never   Smokeless tobacco:  Never  Substance and Sexual Activity   Alcohol use: Yes    Comment: few times a month    Drug use: No   Sexual activity: Not on file  Other Topics Concern   Not on file  Social History Narrative   Not on file   Social Determinants of Health   Financial Resource Strain: Not on file  Food Insecurity: Not on file  Transportation Needs: Not on file  Physical Activity: Not on file  Stress: Not on file  Social Connections: Not on file  Intimate Partner Violence: Not on file      Review of Systems  Constitutional:  Negative for activity change, appetite change, chills, fatigue, fever and unexpected weight change.  HENT: Negative.  Negative for congestion, ear pain, rhinorrhea, sore throat and trouble swallowing.   Eyes: Negative.   Respiratory: Negative.  Negative for cough, chest tightness, shortness of breath and wheezing.   Cardiovascular: Negative.  Negative for chest pain.  Gastrointestinal: Negative.  Negative for abdominal pain, blood in stool, constipation, diarrhea, nausea and vomiting.  Endocrine: Negative.   Genitourinary: Negative.  Negative for difficulty urinating, dysuria, frequency, hematuria and urgency.  Musculoskeletal: Negative.  Negative for arthralgias, back pain, joint swelling, myalgias and neck pain.  Skin: Negative.  Negative for rash and wound.  Allergic/Immunologic: Negative.  Negative for immunocompromised state.  Neurological: Negative.  Negative for dizziness, seizures, numbness and headaches.  Hematological: Negative.   Psychiatric/Behavioral: Negative.  Negative for behavioral problems, self-injury and suicidal ideas. The patient is not nervous/anxious.     Vital Signs: BP (!) 143/77   Pulse 76   Temp (!) 97.2 F (36.2 C)   Resp 16   Ht 5\' 4"  (1.626 m)   Wt 199 lb (90.3 kg)   SpO2 98%   BMI 34.16 kg/m    Physical Exam Vitals reviewed.  Constitutional:      General: She is awake. She is not in acute distress.    Appearance: Normal  appearance. She is well-developed and well-groomed. She is obese. She is not ill-appearing or diaphoretic.  HENT:     Head: Normocephalic and atraumatic.     Right Ear: Tympanic membrane, ear canal and external ear normal.     Left Ear: Tympanic membrane, ear canal and external ear normal.     Nose: Nose normal. No congestion or rhinorrhea.     Mouth/Throat:     Lips: Pink.     Mouth: Mucous membranes are moist.     Pharynx: Oropharynx is clear. Uvula midline. No oropharyngeal exudate or posterior oropharyngeal erythema.  Eyes:  General: Lids are normal. Vision grossly intact. Gaze aligned appropriately. No scleral icterus.       Right eye: No discharge.        Left eye: No discharge.     Extraocular Movements: Extraocular movements intact.     Conjunctiva/sclera: Conjunctivae normal.     Pupils: Pupils are equal, round, and reactive to light.     Funduscopic exam:    Right eye: Red reflex present.        Left eye: Red reflex present. Neck:     Thyroid: No thyromegaly.     Vascular: No JVD.     Trachea: Trachea and phonation normal. No tracheal deviation.  Cardiovascular:     Rate and Rhythm: Normal rate and regular rhythm.     Heart sounds: Normal heart sounds, S1 normal and S2 normal. No murmur heard.    No friction rub. No gallop.  Pulmonary:     Effort: Pulmonary effort is normal. No accessory muscle usage or respiratory distress.     Breath sounds: Normal breath sounds and air entry. No stridor. No wheezing or rales.  Chest:     Chest wall: No tenderness.     Comments: Declined clinical breast exam, gets annual mammograms, most recent one was normal. Abdominal:     General: Bowel sounds are normal. There is no distension.     Palpations: Abdomen is soft. There is no shifting dullness, fluid wave, mass or pulsatile mass.     Tenderness: There is no abdominal tenderness. There is no guarding or rebound.  Musculoskeletal:        General: No tenderness or deformity. Normal  range of motion.     Cervical back: Normal range of motion and neck supple.     Right lower leg: No edema.     Left lower leg: No edema.  Lymphadenopathy:     Cervical: No cervical adenopathy.  Skin:    General: Skin is warm and dry.     Coloration: Skin is not pale.     Findings: No erythema or rash.  Neurological:     Mental Status: She is alert and oriented to person, place, and time.     Cranial Nerves: No cranial nerve deficit.     Motor: No abnormal muscle tone.     Coordination: Coordination normal.     Gait: Gait normal.     Deep Tendon Reflexes: Reflexes are normal and symmetric.  Psychiatric:        Mood and Affect: Mood normal.        Behavior: Behavior normal. Behavior is cooperative.        Thought Content: Thought content normal.        Judgment: Judgment normal.        Assessment/Plan: 1. Encounter for routine adult health examination with abnormal findings Age-appropriate preventive screenings and vaccinations discussed, annual physical exam completed. Routine labs for health maintenance ordered, see below. PHM updated.   2. Mixed hyperlipidemia Routine lab ordered - Lipid Profile  3. Prediabetes Routine labs ordered - Lipid Profile - Hgb A1C w/o eAG  4. Erythrocytosis Routine lab ordered - CBC with Differential/Platelet  5. Dysuria Routine urinalysis done  - UA/M w/rflx Culture, Routine  6. Screening for colorectal cancer Cologuard ordered - Cologuard      General Counseling: lariya tufo understanding of the findings of todays visit and agrees with plan of treatment. I have discussed any further diagnostic evaluation that [place] be needed or ordered today. We  also reviewed her medications today. she has been encouraged to call the office with any questions or concerns that should arise related to todays visit.    Orders Placed This Encounter  Procedures   UA/M w/rflx Culture, Routine   Cologuard   CBC with Differential/Platelet    Lipid Profile   Hgb A1C w/o eAG    No orders of the defined types were placed in this encounter.   Return in about 6 months (around 02/07/2023) for F/U, Prisma Decarlo PCP.   Total time spent:30 Minutes Time spent includes review of chart, medications, test results, and follow up plan with the patient.    Controlled Substance Database was reviewed by me.  This patient was seen by Jonetta Osgood, FNP-C in collaboration with Dr. Clayborn Bigness as a part of collaborative care agreement.  Zahid Carneiro R. Valetta Fuller, MSN, FNP-C Internal medicine

## 2022-08-08 NOTE — Telephone Encounter (Signed)
Surgical clearance completed. Faxed back to [facility]; [phone]

## 2022-08-10 LAB — UA/M W/RFLX CULTURE, ROUTINE
Bilirubin, UA: NEGATIVE
Glucose, UA: NEGATIVE
Ketones, UA: NEGATIVE
Nitrite, UA: NEGATIVE
Protein,UA: NEGATIVE
RBC, UA: NEGATIVE
Specific Gravity, UA: 1.021 (ref 1.005–1.030)
Urobilinogen, Ur: 0.2 mg/dL (ref 0.2–1.0)
pH, UA: 5.5 (ref 5.0–7.5)

## 2022-08-10 LAB — MICROSCOPIC EXAMINATION
Casts: NONE SEEN /lpf
Epithelial Cells (non renal): >10 /hpf — AB (ref 0–10)
RBC, Urine: NONE SEEN /hpf (ref 0–2)

## 2022-08-10 LAB — URINE CULTURE, REFLEX

## 2022-08-24 ENCOUNTER — Encounter: Payer: Self-pay | Admitting: Nurse Practitioner

## 2022-08-26 LAB — CBC WITH DIFFERENTIAL/PLATELET
Basophils Absolute: 0.1 10*3/uL (ref 0.0–0.2)
Basos: 1 %
EOS (ABSOLUTE): 0.2 10*3/uL (ref 0.0–0.4)
Eos: 3 %
Hematocrit: 51.2 % — ABNORMAL HIGH (ref 34.0–46.6)
Hemoglobin: 16.8 g/dL — ABNORMAL HIGH (ref 11.1–15.9)
Immature Grans (Abs): 0 10*3/uL (ref 0.0–0.1)
Immature Granulocytes: 0 %
Lymphocytes Absolute: 1.8 10*3/uL (ref 0.7–3.1)
Lymphs: 27 %
MCH: 28.8 pg (ref 26.6–33.0)
MCHC: 32.8 g/dL (ref 31.5–35.7)
MCV: 88 fL (ref 79–97)
Monocytes Absolute: 0.5 10*3/uL (ref 0.1–0.9)
Monocytes: 7 %
Neutrophils Absolute: 4.2 10*3/uL (ref 1.4–7.0)
Neutrophils: 62 %
Platelets: 253 10*3/uL (ref 150–450)
RBC: 5.83 x10E6/uL — ABNORMAL HIGH (ref 3.77–5.28)
RDW: 13.5 % (ref 11.7–15.4)
WBC: 6.7 10*3/uL (ref 3.4–10.8)

## 2022-08-26 LAB — LIPID PANEL
Chol/HDL Ratio: 3.1 ratio (ref 0.0–4.4)
Cholesterol, Total: 199 mg/dL (ref 100–199)
HDL: 65 mg/dL (ref >39)
LDL Chol Calc (NIH): 109 mg/dL — ABNORMAL HIGH (ref 0–99)
Triglycerides: 144 mg/dL (ref 0–149)
VLDL Cholesterol Cal: 25 mg/dL (ref 5–40)

## 2022-08-26 LAB — HGB A1C W/O EAG: Hgb A1c MFr Bld: 6.1 % — ABNORMAL HIGH (ref 4.8–5.6)

## 2022-09-07 LAB — COLOGUARD: COLOGUARD: NEGATIVE

## 2022-09-12 ENCOUNTER — Telehealth: Payer: 59 | Admitting: Nurse Practitioner

## 2022-09-12 DIAGNOSIS — J069 Acute upper respiratory infection, unspecified: Secondary | ICD-10-CM

## 2022-09-12 DIAGNOSIS — U071 COVID-19: Secondary | ICD-10-CM | POA: Diagnosis not present

## 2022-09-12 NOTE — Progress Notes (Signed)
[facility] [place], [place] [address]  Internal MEDICINE  Telephone Visit  Patient Name: Valerie Thornton  [id number]  [id number]  Date of Service: 09/12/2022  I connected with the patient at 1615 by telephone and verified the patients identity using two identifiers.   I discussed the limitations, risks, security and privacy concerns of performing an evaluation and management service by telephone and the availability of in person appointments. I also discussed with the patient that there Mull be a patient responsible charge related to the service.  The patient expressed understanding and agrees to proceed.    Chief Complaint  Patient presents with   Telephone Assessment    [id number]   Telephone Screen    Covid positive symptoms start  yesterday    Cough   Sinusitis   Headache   Chills    HPI Valerie Thornton presents for a telehealth virtual visit for COVID positive URI Sx started yesterday for sinus pressure, headache, chills, cough.  Tested covid positive   Current Medication: Outpatient Encounter Medications as of 09/12/2022  Medication Sig   amLODipine (NORVASC) 5 MG tablet TAKE 1 TABLET(5 MG) BY MOUTH DAILY   benazepril (LOTENSIN) 5 MG tablet TAKE 1 TABLET(5 MG) BY MOUTH DAILY   clopidogrel (PLAVIX) 75 MG tablet TAKE 1 TABLET(75 MG) BY MOUTH DAILY   clotrimazole-betamethasone (LOTRISONE) cream Apply 1 application topically 2 (two) times daily.   lovastatin (MEVACOR) 40 MG tablet TAKE 1 TABLET(40 MG) BY MOUTH AT BEDTIME   meloxicam (MOBIC) 15 MG tablet TAKE 1 TABLET BY MOUTH EVERY DAY WITH FOOD FOR 7 DAYS THEN AS NEEDED   triamcinolone (KENALOG) 0.025 % cream Apply 1 application topically 2 (two) times daily.   No facility-administered encounter medications on file as of 09/12/2022.    Surgical History: Past Surgical History:  Procedure Laterality Date   TONSILLECTOMY AND ADENOIDECTOMY     pt was 67 years old    Medical History: Past Medical History:   Diagnosis Date   Erythrocytosis    Hypertension    Migraines    per pt it was hormonal and food caused, hardly anymore Valerie Thornton has one   TIA (transient ischemic attack) 2015   affected right side but completely resolved    Family History: Family History  Problem Relation Age of Onset   Breast cancer Maternal Aunt 39   Hypertension Mother    Hypertension Sister     Social History   Socioeconomic History   Marital status: Married    Spouse name: Not on file   Number of children: Not on file   Years of education: Not on file   Highest education level: Not on file  Occupational History   Not on file  Tobacco Use   Smoking status: Never   Smokeless tobacco: Never  Substance and Sexual Activity   Alcohol use: Yes    Comment: few times a month    Drug use: No   Sexual activity: Not on file  Other Topics Concern   Not on file  Social History Narrative   Not on file   Social Determinants of Health   Financial Resource Strain: Not on file  Food Insecurity: Not on file  Transportation Needs: Not on file  Physical Activity: Not on file  Stress: Not on file  Social Connections: Not on file  Intimate Partner Violence: Not on file      Review of Systems  Constitutional:  Positive for chills and fatigue. Negative for fever.  HENT:  Positive for congestion, postnasal drip and sinus pressure. Negative for rhinorrhea, sinus pain and sore throat.   Respiratory:  Positive for cough. Negative for chest tightness, shortness of breath and wheezing.   Cardiovascular: Negative.  Negative for chest pain and palpitations.  Neurological:  Positive for headaches.    Vital Signs: There were no vitals taken for this visit.   Observation/Objective: Valerie Thornton is alert and oriented and engages in conversation appropriately. No acute distress noted.     Assessment/Plan: 1. Upper respiratory tract infection due to COVID-19 virus Patient declined antiviral medication, her symptoms are  improving at this time. Instructed patient to call in the next 1-2 days if symptoms worsen and Valerie Thornton decides to take the antiviral medication.    General Counseling: Valerie Thornton understanding of the findings of today's phone visit and agrees with plan of treatment. I have discussed any further diagnostic evaluation that Valerie Thornton be needed or ordered today. We also reviewed her medications today. Valerie Thornton has been encouraged to call the office with any questions or concerns that should arise related to todays visit.  Return if symptoms worsen or fail to improve.   No orders of the defined types were placed in this encounter.   No orders of the defined types were placed in this encounter.   Time spent:10 Minutes Time spent with patient included reviewing progress notes, labs, imaging studies, and discussing plan for follow up.  [place] Controlled Substance Database was reviewed by me for overdose risk score (ORS) if appropriate.  This patient was seen by Jonetta Osgood, FNP-C in collaboration with Dr. Clayborn Bigness as a part of collaborative care agreement.  Ariatna Jester R. Valetta Fuller, MSN, FNP-C Internal medicine

## 2022-09-13 ENCOUNTER — Encounter: Payer: Self-pay | Admitting: Nurse Practitioner

## 2022-09-15 ENCOUNTER — Telehealth: Payer: Self-pay

## 2022-09-15 ENCOUNTER — Other Ambulatory Visit: Payer: Self-pay

## 2022-09-15 MED ORDER — AZITHROMYCIN 250 MG PO TABS: 250.0000 mg | ORAL_TABLET | Freq: Every day | ORAL | 0 refills | Status: DC

## 2022-09-15 NOTE — Telephone Encounter (Signed)
Pt called back today that she having sinusitis and ear pain as per alyssa send Zithromax for 10 days

## 2022-10-04 ENCOUNTER — Other Ambulatory Visit: Payer: Self-pay | Admitting: Nurse Practitioner

## 2022-10-04 DIAGNOSIS — D582 Other hemoglobinopathies: Secondary | ICD-10-CM

## 2022-10-04 NOTE — Progress Notes (Signed)
Call patient about lab results: --chronically elevated RBC, hemoglobin and hematocrit -- need further evaluation from hematology-- I have ordered a referral --cholesterol levels are improving --Cologuard test was negative, repeat in 3 years.  --A1c is 6.1, stable

## 2022-10-05 ENCOUNTER — Telehealth: Payer: Self-pay

## 2022-10-05 NOTE — Telephone Encounter (Signed)
-----   Message from Jonetta Osgood, NP sent at 10/04/2022  9:57 PM EST ----- Call patient about lab results: --chronically elevated RBC, hemoglobin and hematocrit -- need further evaluation from hematology-- I have ordered a referral --cholesterol levels are improving --Cologuard test was negative, repeat in 3 years.  --A1c is 6.1, stable

## 2022-10-05 NOTE — Telephone Encounter (Signed)
Patient notified

## 2022-10-07 ENCOUNTER — Telehealth: Payer: Self-pay

## 2022-10-07 NOTE — Telephone Encounter (Signed)
Courtesy call made to patient. Informed of cancer center location, mask policy and visitor policy. All pt questions answered.

## 2022-10-10 ENCOUNTER — Other Ambulatory Visit: Payer: Self-pay | Admitting: *Deleted

## 2022-10-10 ENCOUNTER — Inpatient Hospital Stay: Payer: 59

## 2022-10-10 ENCOUNTER — Inpatient Hospital Stay: Payer: 59 | Attending: Oncology | Admitting: Oncology

## 2022-10-10 ENCOUNTER — Encounter: Payer: Self-pay | Admitting: Oncology

## 2022-10-10 VITALS — BP 132/78 | HR 75 | Temp 96.6°F | Resp 19 | Wt 198.2 lb

## 2022-10-10 DIAGNOSIS — I10 Essential (primary) hypertension: Secondary | ICD-10-CM | POA: Diagnosis not present

## 2022-10-10 DIAGNOSIS — D751 Secondary polycythemia: Secondary | ICD-10-CM

## 2022-10-10 DIAGNOSIS — Z8673 Personal history of transient ischemic attack (TIA), and cerebral infarction without residual deficits: Secondary | ICD-10-CM | POA: Insufficient documentation

## 2022-10-10 DIAGNOSIS — Z803 Family history of malignant neoplasm of breast: Secondary | ICD-10-CM | POA: Diagnosis not present

## 2022-10-10 LAB — CBC WITH DIFFERENTIAL/PLATELET
Abs Immature Granulocytes: 0.01 10*3/uL (ref 0.00–0.07)
Basophils Absolute: 0.1 10*3/uL (ref 0.0–0.1)
Basophils Relative: 1 %
Eosinophils Absolute: 0.1 10*3/uL (ref 0.0–0.5)
Eosinophils Relative: 2 %
HCT: 47.6 % — ABNORMAL HIGH (ref 36.0–46.0)
Hemoglobin: 15.7 g/dL — ABNORMAL HIGH (ref 12.0–15.0)
Immature Granulocytes: 0 %
Lymphocytes Relative: 28 %
Lymphs Abs: 1.9 10*3/uL (ref 0.7–4.0)
MCH: 29.2 pg (ref 26.0–34.0)
MCHC: 33 g/dL (ref 30.0–36.0)
MCV: 88.6 fL (ref 80.0–100.0)
Monocytes Absolute: 0.6 10*3/uL (ref 0.1–1.0)
Monocytes Relative: 9 %
Neutro Abs: 4.2 10*3/uL (ref 1.7–7.7)
Neutrophils Relative %: 60 %
Platelets: 237 10*3/uL (ref 150–400)
RBC: 5.37 MIL/uL — ABNORMAL HIGH (ref 3.87–5.11)
RDW: 13.8 % (ref 11.5–15.5)
WBC: 6.9 10*3/uL (ref 4.0–10.5)
nRBC: 0 % (ref 0.0–0.2)

## 2022-10-10 LAB — URINALYSIS, COMPLETE (UACMP) WITH MICROSCOPIC
Bilirubin Urine: NEGATIVE
Glucose, UA: NEGATIVE mg/dL
Hgb urine dipstick: NEGATIVE
Ketones, ur: NEGATIVE mg/dL
Leukocytes,Ua: NEGATIVE
Nitrite: NEGATIVE
Protein, ur: NEGATIVE mg/dL
Specific Gravity, Urine: 1.028 (ref 1.005–1.030)
pH: 5 (ref 5.0–8.0)

## 2022-10-10 NOTE — Progress Notes (Signed)
Hematology/Oncology Consult note [facility] Telephone:([phone] [phone]  Patient Care Team: Jonetta Osgood, NP as PCP - General (Nurse Practitioner) Lavera Guise, MD (Internal Medicine)   Name of the patient: Valerie Thornton  [id number]  21-Mar-1956    Reason for referral-erythrocytosis   Referring physician-Alyssa Valetta Fuller, NP  Date of visit: 10/10/22   History of presenting illness- Patient is a 67 year old female with a past medical history significant for hypertension migraines and possible TIA who has been referred for elevated hemoglobin.  CBC from 08/25/2022 showed H&H of 16.8/51.2 with a white count of 6.2 and platelets of 253.  Looking back at her prior CBCs her hemoglobin has been between 16-17 dating back to 2017 with no clear rising trend.  Patient is a lifelong non-smoker.  No baseline history of any chronic lung diseases.  Denies any family history of polycythemia.  ECOG PS- 1  Pain scale- 0   Review of systems- Review of Systems  Constitutional:  Negative for chills, fever, malaise/fatigue and weight loss.  HENT:  Negative for congestion, ear discharge and nosebleeds.   Eyes:  Negative for blurred vision.  Respiratory:  Negative for cough, hemoptysis, sputum production, shortness of breath and wheezing.   Cardiovascular:  Negative for chest pain, palpitations, orthopnea and claudication.  Gastrointestinal:  Negative for abdominal pain, blood in stool, constipation, diarrhea, heartburn, melena, nausea and vomiting.  Genitourinary:  Negative for dysuria, flank pain, frequency, hematuria and urgency.  Musculoskeletal:  Negative for back pain, joint pain and myalgias.  Skin:  Negative for rash.  Neurological:  Negative for dizziness, tingling, focal weakness, seizures, weakness and headaches.  Endo/Heme/Allergies:  Does not bruise/bleed easily.  Psychiatric/Behavioral:  Negative for depression and suicidal ideas. The patient does  not have insomnia.     Allergies  Allergen Reactions   Sulfa Antibiotics Hives    Patient Active Problem List   Diagnosis Date Noted   History of frequent ear infections in childhood 04/18/2021   Fatigue 04/18/2021   Nonspecific abnormal finding 04/18/2021   Personal history of urinary calculi 04/18/2021   Family planning 04/18/2021   Encounter for immunization 04/18/2021   Screening breast examination 04/18/2021   Screening for gout 04/18/2021   Encounter for screening for malignant neoplasm of colon 04/18/2021   Encounter for gynecological examination without abnormal finding 04/18/2021   Prediabetes 08/03/2020   BMI 34.0-34.9,adult 08/03/2020   Cutaneous candidiasis 07/11/2020   Needs flu shot 07/11/2020   Multinodular goiter 01/18/2020   History of TIAs 02/05/2018   Thyromegaly 02/05/2018   Encounter for general adult medical examination with abnormal findings 01/14/2018   Right upper quadrant abdominal pain 01/14/2018   Essential hypertension 01/14/2018   Mixed hyperlipidemia 01/14/2018   Obesity (BMI 30.0-34.9) 01/14/2018   Vitamin D deficiency 01/14/2018   Dysuria 01/14/2018   Osteoarthritis of knee 10/04/2017   Blood in the urine 05/26/2008   Acute onset aura migraine 05/25/2007   Encounter for general adult medical examination without abnormal findings 05/23/2006     Past Medical History:  Diagnosis Date   Erythrocytosis    Hypertension    Migraines    per pt it was hormonal and food caused, hardly anymore she has one   TIA (transient ischemic attack) 2015   affected right side but completely resolved     Past Surgical History:  Procedure Laterality Date   TONSILLECTOMY AND ADENOIDECTOMY     pt was 67 years old    Social History   Socioeconomic  History   Marital status: Married    Spouse name: Not on file   Number of children: Not on file   Years of education: Not on file   Highest education level: Not on file  Occupational History   Not on  file  Tobacco Use   Smoking status: Never   Smokeless tobacco: Never  Substance and Sexual Activity   Alcohol use: Yes    Comment: few times a month    Drug use: No   Sexual activity: Not on file  Other Topics Concern   Not on file  Social History Narrative   Not on file   Social Determinants of Health   Financial Resource Strain: Not on file  Food Insecurity: Not on file  Transportation Needs: Not on file  Physical Activity: Not on file  Stress: Not on file  Social Connections: Not on file  Intimate Partner Violence: Not on file     Family History  Problem Relation Age of Onset   Breast cancer Maternal Aunt 34   Hypertension Mother    Hypertension Sister      Current Outpatient Medications:    amLODipine (NORVASC) 5 MG tablet, TAKE 1 TABLET(5 MG) BY MOUTH DAILY, Disp: 90 tablet, Rfl: 1   azithromycin (ZITHROMAX) 250 MG tablet, Take 1 tablet (250 mg total) by mouth daily., Disp: 10 tablet, Rfl: 0   benazepril (LOTENSIN) 5 MG tablet, TAKE 1 TABLET(5 MG) BY MOUTH DAILY, Disp: 90 tablet, Rfl: 1   clopidogrel (PLAVIX) 75 MG tablet, TAKE 1 TABLET(75 MG) BY MOUTH DAILY, Disp: 90 tablet, Rfl: 1   clotrimazole-betamethasone (LOTRISONE) cream, Apply 1 application topically 2 (two) times daily., Disp: 45 g, Rfl: 2   lovastatin (MEVACOR) 40 MG tablet, TAKE 1 TABLET(40 MG) BY MOUTH AT BEDTIME, Disp: 90 tablet, Rfl: 1   meloxicam (MOBIC) 15 MG tablet, TAKE 1 TABLET BY MOUTH EVERY DAY WITH FOOD FOR 7 DAYS THEN AS NEEDED, Disp: 30 tablet, Rfl: 3   triamcinolone (KENALOG) 0.025 % cream, Apply 1 application topically 2 (two) times daily., Disp: 80 g, Rfl: 2   Physical exam: There were no vitals filed for this visit. Physical Exam Cardiovascular:     Rate and Rhythm: Normal rate and regular rhythm.     Heart sounds: Normal heart sounds.  Pulmonary:     Effort: Pulmonary effort is normal.     Breath sounds: Normal breath sounds.  Abdominal:     General: Bowel sounds are normal.      Palpations: Abdomen is soft.     Comments: No palpable hepatosplenomegaly  Skin:    General: Skin is warm and dry.  Neurological:     Mental Status: She is alert and oriented to person, place, and time.           Latest Ref Rng & Units 02/04/2022    7:59 AM  CMP  Glucose 70 - 99 mg/dL 101   BUN 8 - 27 mg/dL 14   Creatinine 0.57 - 1.00 mg/dL 0.85   Sodium 134 - 144 mmol/L 143   Potassium 3.5 - 5.2 mmol/L 4.6   Chloride 96 - 106 mmol/L 103   CO2 20 - 29 mmol/L 25   Calcium 8.7 - 10.3 mg/dL 9.5   Total Protein 6.0 - 8.5 g/dL 6.7   Total Bilirubin 0.0 - 1.2 mg/dL 0.6   Alkaline Phos 44 - 121 IU/L 93   AST 0 - 40 IU/L 15   ALT 0 - 32 IU/L  20       Latest Ref Rng & Units 08/25/2022    7:04 AM  CBC  WBC 3.4 - 10.8 x10E3/uL 6.7   Hemoglobin 11.1 - 15.9 g/dL 16.8   Hematocrit 34.0 - 46.6 % 51.2   Platelets 150 - 450 x10E3/uL 253     No images are attached to the encounter.  No results found.  Assessment and plan- Patient is a 67 y.o. female referred for polycythemia  Patient has had mild chronic polycythemia with a hemoglobin between 16.5-17.5 and a hematocrit that fluctuates between 49-51.  She has had TIA in 2015 which was attributed to hypertension and birth control but none since then.  She is on Plavix for her TIA.  Discussed differences between primary polycythemia vera and secondary polycythemia.  Given that her hemoglobin has not shown a consistent rising trend I doubt that we are dealing with P vera but I plan to check CBC with differential, EPO levels, JAK2 mutation and exon 12 testing today.  I will also be checking urinalysis today to rule out any secondary polycythemia.  I will see the patient back in 3 weeks time to discuss the results of blood work.  If blood work is not consistent with P vera patient does not require Hydrea or phlebotomy.  It is unlikely that her prior TIA was related to her polycythemia.   Thank you for this kind referral and the opportunity to  participate in the care of this  Patient   Visit Diagnosis No diagnosis found.  Dr. Randa Evens, MD, MPH [facility] at [facility] [id number] 10/10/2022

## 2022-10-11 LAB — ERYTHROPOIETIN: Erythropoietin: 13 m[IU]/mL (ref 2.6–18.5)

## 2022-10-16 LAB — JAK2 GENOTYPR

## 2022-10-18 LAB — NGS JAK2 EXONS 12-15

## 2022-11-02 ENCOUNTER — Inpatient Hospital Stay: Payer: 59 | Attending: Oncology | Admitting: Oncology

## 2022-11-02 ENCOUNTER — Encounter: Payer: Self-pay | Admitting: Oncology

## 2022-11-02 VITALS — BP 127/64 | HR 80 | Temp 97.8°F | Resp 18 | Ht 64.0 in | Wt 192.8 lb

## 2022-11-02 DIAGNOSIS — I10 Essential (primary) hypertension: Secondary | ICD-10-CM | POA: Insufficient documentation

## 2022-11-02 DIAGNOSIS — Z803 Family history of malignant neoplasm of breast: Secondary | ICD-10-CM | POA: Diagnosis not present

## 2022-11-02 DIAGNOSIS — D751 Secondary polycythemia: Secondary | ICD-10-CM | POA: Diagnosis not present

## 2022-11-02 NOTE — Progress Notes (Signed)
Hematology/Oncology Consult note [facility]  [phone] [phone]  Patient Care Team: Jonetta Osgood, NP as PCP - General (Nurse Practitioner) Lavera Guise, MD (Internal Medicine)   Name of the patient: Valerie Thornton  [id number]  September 13, 1955   Date of visit: 11/02/22  Diagnosis-   Secondary polycythemia of unclear etiology  Chief complaint/ Reason for visit-routine follow-up of secondary polycythemia  Heme/Onc history: Patient is a 67 year old female with a past medical history significant for hypertension migraines and possible TIA who has been referred for elevated hemoglobin.  CBC from 08/25/2022 showed H&H of 16.8/51.2 with a white count of 6.2 and platelets of 253.  Looking back at her prior CBCs her hemoglobin has been between 16-17 dating back to 2017 with no clear rising trend.   Patient is a lifelong non-smoker.  No baseline history of any chronic lung diseases.  Denies any family history of polycythemia.  Results of blood work from February 2024 showed normal EPO levels.  No evidence of JAK2 mutation.  Example mutation testing negative.  Urinalysis negative for hematuria.  Interval history-patient recently had shoulder surgery about 2 weeks ago and is recovering from that.  Denies any new complaints at this time.  ECOG PS- 1 Pain scale- 3   Review of systems- Review of Systems  Constitutional:  Negative for chills, fever, malaise/fatigue and weight loss.  HENT:  Negative for congestion, ear discharge and nosebleeds.   Eyes:  Negative for blurred vision.  Respiratory:  Negative for cough, hemoptysis, sputum production, shortness of breath and wheezing.   Cardiovascular:  Negative for chest pain, palpitations, orthopnea and claudication.  Gastrointestinal:  Negative for abdominal pain, blood in stool, constipation, diarrhea, heartburn, melena, nausea and vomiting.  Genitourinary:  Negative for dysuria, flank pain, frequency,  hematuria and urgency.  Musculoskeletal:  Negative for back pain, joint pain and myalgias.  Skin:  Negative for rash.  Neurological:  Negative for dizziness, tingling, focal weakness, seizures, weakness and headaches.  Endo/Heme/Allergies:  Does not bruise/bleed easily.  Psychiatric/Behavioral:  Negative for depression and suicidal ideas. The patient does not have insomnia.       Allergies  Allergen Reactions   Sulfa Antibiotics Hives     Past Medical History:  Diagnosis Date   Erythrocytosis    Hypertension    Migraines    per pt it was hormonal and food caused, hardly anymore she has one   TIA (transient ischemic attack) 2015   affected right side but completely resolved     Past Surgical History:  Procedure Laterality Date   TONSILLECTOMY AND ADENOIDECTOMY     pt was 67 years old    Social History   Socioeconomic History   Marital status: Married    Spouse name: Not on file   Number of children: Not on file   Years of education: Not on file   Highest education level: Not on file  Occupational History   Not on file  Tobacco Use   Smoking status: Never   Smokeless tobacco: Never  Substance and Sexual Activity   Alcohol use: Yes    Comment: few times a month    Drug use: No   Sexual activity: Not Currently  Other Topics Concern   Not on file  Social History Narrative   Not on file   Social Determinants of Health   Financial Resource Strain: Not on file  Food Insecurity: No Food Insecurity (10/10/2022)   Hunger Vital Sign  Worried About Charity fundraiser in the Last Year: Never true    [place] in the Last Year: Never true  Transportation Needs: No Transportation Needs (10/10/2022)   PRAPARE - Hydrologist (Medical): No    Lack of Transportation (Non-Medical): No  Physical Activity: Not on file  Stress: Not on file  Social Connections: Not on file  Intimate Partner Violence: Not At Risk (10/10/2022)   Humiliation,  Afraid, Rape, and Kick questionnaire    Fear of Current or Ex-Partner: No    Emotionally Abused: No    Physically Abused: No    Sexually Abused: No    Family History  Problem Relation Age of Onset   Breast cancer Maternal Aunt 13   Hypertension Mother    Hypertension Sister      Current Outpatient Medications:    amLODipine (NORVASC) 5 MG tablet, Take 1 tablet by mouth daily., Disp: , Rfl:    benazepril (LOTENSIN) 5 MG tablet, TAKE 1 TABLET(5 MG) BY MOUTH DAILY, Disp: 90 tablet, Rfl: 1   clopidogrel (PLAVIX) 75 MG tablet, TAKE 1 TABLET(75 MG) BY MOUTH DAILY, Disp: 90 tablet, Rfl: 1   clotrimazole-betamethasone (LOTRISONE) cream, Apply 1 application topically 2 (two) times daily., Disp: 45 g, Rfl: 2   lovastatin (MEVACOR) 40 MG tablet, TAKE 1 TABLET(40 MG) BY MOUTH AT BEDTIME, Disp: 90 tablet, Rfl: 1   methocarbamol (ROBAXIN) 500 MG tablet, TAKE 1 TO 2 TABLETS BY MOUTH FOUR TIMES DAILY AS NEEDED FOR MUSCLE SPASMS, Disp: , Rfl:    oxyCODONE (OXY IR/ROXICODONE) 5 MG immediate release tablet, TAKE 1 TABLET BY MOUTH EVERY 4 TO 6 HOURS AS NEEDED FOR SEVERE PAIN, Disp: , Rfl:    promethazine (PHENERGAN) 25 MG tablet, TAKE 1 TABLET BY MOUTH EVERY 4 HOURS AS NEEDED FOR NAUSEA, Disp: , Rfl:    triamcinolone (KENALOG) 0.025 % cream, Apply 1 application topically 2 (two) times daily., Disp: 80 g, Rfl: 2   meloxicam (MOBIC) 15 MG tablet, TAKE 1 TABLET BY MOUTH EVERY DAY WITH FOOD FOR 7 DAYS THEN AS NEEDED (Patient not taking: Reported on 10/10/2022), Disp: 30 tablet, Rfl: 3  Physical exam:  Vitals:   11/02/22 1456  BP: 127/64  Pulse: 80  Resp: 18  Temp: 97.8 F (36.6 C)  TempSrc: Tympanic  SpO2: 100%  Weight: 192 lb 12.8 oz (87.5 kg)  Height: '5\' 4"'$  (1.626 m)   Physical Exam Cardiovascular:     Rate and Rhythm: Normal rate and regular rhythm.     Heart sounds: Normal heart sounds.  Pulmonary:     Effort: Pulmonary effort is normal.  Musculoskeletal:     Comments: Brace in place over  left shoulder  Skin:    General: Skin is warm and dry.  Neurological:     Mental Status: She is alert and oriented to person, place, and time.         Latest Ref Rng & Units 02/04/2022    7:59 AM  CMP  Glucose 70 - 99 mg/dL 101   BUN 8 - 27 mg/dL 14   Creatinine 0.57 - 1.00 mg/dL 0.85   Sodium 134 - 144 mmol/L 143   Potassium 3.5 - 5.2 mmol/L 4.6   Chloride 96 - 106 mmol/L 103   CO2 20 - 29 mmol/L 25   Calcium 8.7 - 10.3 mg/dL 9.5   Total Protein 6.0 - 8.5 g/dL 6.7   Total Bilirubin 0.0 - 1.2 mg/dL 0.6  Alkaline Phos 44 - 121 IU/L 93   AST 0 - 40 IU/L 15   ALT 0 - 32 IU/L 20       Latest Ref Rng & Units 10/10/2022    2:14 PM  CBC  WBC 4.0 - 10.5 K/uL 6.9   Hemoglobin 12.0 - 15.0 g/dL 15.7   Hematocrit 36.0 - 46.0 % 47.6   Platelets 150 - 400 K/uL 237     Assessment and plan- Patient is a 67 y.o. female referred for polycythemia likely secondary however etiology unclear  Patient's hemoglobin has been fluctuating around 16-17 for the last 6 years without a clear rising trend.  No evidence of leukocytosis or thrombocytosis.  Workup for primary polycythemia vera has been negative.  JAK2 mutation negative, exon 12 mutation negative and EPO levels are normal therefore making primary polycythemia vera unlikely.  I am not inclined to do a bone marrow biopsy at this time given that her counts have overall remained stable.  She does not require phlebotomy at this time.  Repeat CBC in 4 months and 8 months and I will see her back in 8 months   Visit Diagnosis 1. Polycythemia, secondary      Dr. Randa Evens, MD, MPH [facility] [id number] 11/02/2022 4:13 PM

## 2022-11-02 NOTE — Progress Notes (Signed)
No concerns. 

## 2022-11-07 ENCOUNTER — Ambulatory Visit: Payer: 59 | Admitting: Dermatology

## 2022-11-16 ENCOUNTER — Other Ambulatory Visit: Payer: Self-pay | Admitting: Nurse Practitioner

## 2022-11-16 DIAGNOSIS — E782 Mixed hyperlipidemia: Secondary | ICD-10-CM

## 2023-01-09 ENCOUNTER — Other Ambulatory Visit: Payer: Self-pay | Admitting: Nurse Practitioner

## 2023-01-09 DIAGNOSIS — Z8673 Personal history of transient ischemic attack (TIA), and cerebral infarction without residual deficits: Secondary | ICD-10-CM

## 2023-01-28 ENCOUNTER — Other Ambulatory Visit: Payer: Self-pay | Admitting: Nurse Practitioner

## 2023-01-28 DIAGNOSIS — I10 Essential (primary) hypertension: Secondary | ICD-10-CM

## 2023-01-30 NOTE — Telephone Encounter (Signed)
Please review and send 

## 2023-02-06 ENCOUNTER — Encounter: Payer: Self-pay | Admitting: Nurse Practitioner

## 2023-02-06 ENCOUNTER — Ambulatory Visit (INDEPENDENT_AMBULATORY_CARE_PROVIDER_SITE_OTHER): Payer: 59 | Admitting: Nurse Practitioner

## 2023-02-06 VITALS — BP 130/80 | HR 71 | Temp 98.4°F | Resp 16 | Ht 64.0 in | Wt 195.0 lb

## 2023-02-06 DIAGNOSIS — I10 Essential (primary) hypertension: Secondary | ICD-10-CM

## 2023-02-06 DIAGNOSIS — Z23 Encounter for immunization: Secondary | ICD-10-CM | POA: Diagnosis not present

## 2023-02-06 DIAGNOSIS — E782 Mixed hyperlipidemia: Secondary | ICD-10-CM

## 2023-02-06 MED ORDER — AMLODIPINE BESYLATE 5 MG PO TABS: ORAL_TABLET | ORAL | 1 refills | Status: DC

## 2023-02-06 MED ORDER — TETANUS-DIPHTH-ACELL PERTUSSIS 5-2.5-18.5 LF-MCG/0.5 IM SUSP: 0.5000 mL | Freq: Once | INTRAMUSCULAR | 0 refills | Status: AC

## 2023-02-06 MED ORDER — LOVASTATIN 40 MG PO TABS: ORAL_TABLET | ORAL | 1 refills | Status: DC

## 2023-02-06 NOTE — Progress Notes (Signed)
[facility] [address] [place], [place] [address]  Internal MEDICINE  Office Visit Note  Patient Name: Valerie Thornton  [id number]  [id number]  Date of Service: 02/06/2023  Chief Complaint  Patient presents with   Hypertension   Follow-up    HPI Valerie Thornton presents for a follow-up visit for hypertension and refills Hypertension -- controlled with current medications,  Had shoulder surgery right side in February -- doing much better, not taking any medicatiosn for pain  Cologuard done in January  Due for tetanus shot   Current Medication: Outpatient Encounter Medications as of 02/06/2023  Medication Sig   benazepril (LOTENSIN) 5 MG tablet TAKE 1 TABLET(5 MG) BY MOUTH DAILY   clopidogrel (PLAVIX) 75 MG tablet TAKE 1 TABLET(75 MG) BY MOUTH DAILY   clotrimazole-betamethasone (LOTRISONE) cream Apply 1 application topically 2 (two) times daily.   [DISCONTINUED] amLODipine (NORVASC) 5 MG tablet TAKE 1 TABLET(5 MG) BY MOUTH DAILY   [DISCONTINUED] lovastatin (MEVACOR) 40 MG tablet TAKE 1 TABLET(40 MG) BY MOUTH AT BEDTIME   [DISCONTINUED] meloxicam (MOBIC) 15 MG tablet TAKE 1 TABLET BY MOUTH EVERY DAY WITH FOOD FOR 7 DAYS THEN AS NEEDED   [DISCONTINUED] methocarbamol (ROBAXIN) 500 MG tablet TAKE 1 TO 2 TABLETS BY MOUTH FOUR TIMES DAILY AS NEEDED FOR MUSCLE SPASMS   [DISCONTINUED] oxyCODONE (OXY IR/ROXICODONE) 5 MG immediate release tablet TAKE 1 TABLET BY MOUTH EVERY 4 TO 6 HOURS AS NEEDED FOR SEVERE PAIN   [DISCONTINUED] promethazine (PHENERGAN) 25 MG tablet TAKE 1 TABLET BY MOUTH EVERY 4 HOURS AS NEEDED FOR NAUSEA   [DISCONTINUED] Tdap (BOOSTRIX) 5-2.5-18.5 LF-MCG/0.5 injection Inject 0.5 mLs into the muscle once.   [DISCONTINUED] triamcinolone (KENALOG) 0.025 % cream Apply 1 application topically 2 (two) times daily.   amLODipine (NORVASC) 5 MG tablet TAKE 1 TABLET(5 MG) BY MOUTH DAILY   lovastatin (MEVACOR) 40 MG tablet TAKE 1 TABLET(40 MG) BY MOUTH AT BEDTIME   Tdap (BOOSTRIX)  5-2.5-18.5 LF-MCG/0.5 injection Inject 0.5 mLs into the muscle once for 1 dose.   No facility-administered encounter medications on file as of 02/06/2023.    Surgical History: Past Surgical History:  Procedure Laterality Date   TONSILLECTOMY AND ADENOIDECTOMY     pt was 67 years old    Medical History: Past Medical History:  Diagnosis Date   Erythrocytosis    Hypertension    Migraines    per pt it was hormonal and food caused, hardly anymore she has one   TIA (transient ischemic attack) 2015   affected right side but completely resolved    Family History: Family History  Problem Relation Age of Onset   Breast cancer Maternal Aunt 76   Hypertension Mother    Hypertension Sister     Social History   Socioeconomic History   Marital status: Married    Spouse name: Not on file   Number of children: Not on file   Years of education: Not on file   Highest education level: Not on file  Occupational History   Not on file  Tobacco Use   Smoking status: Never   Smokeless tobacco: Never  Substance and Sexual Activity   Alcohol use: Yes    Comment: few times a month    Drug use: No   Sexual activity: Not Currently  Other Topics Concern   Not on file  Social History Narrative   Not on file   Social Determinants of Health   Financial Resource Strain: Not on file  Food Insecurity:  No Food Insecurity (10/10/2022)   Hunger Vital Sign    Worried About Running Out of Food in the Last Year: Never true    Ran Out of Food in the Last Year: Never true  Transportation Needs: No Transportation Needs (10/10/2022)   PRAPARE - Administrator, Civil Service (Medical): No    Lack of Transportation (Non-Medical): No  Physical Activity: Not on file  Stress: Not on file  Social Connections: Not on file  Intimate Partner Violence: Not At Risk (10/10/2022)   Humiliation, Afraid, Rape, and Kick questionnaire    Fear of Current or Ex-Partner: No    Emotionally Abused: No     Physically Abused: No    Sexually Abused: No      Review of Systems  Constitutional: Negative.  Negative for chills, fatigue and unexpected weight change.  HENT: Negative.  Negative for congestion, rhinorrhea, sneezing and sore throat.   Eyes:  Negative for redness.  Respiratory: Negative.  Negative for cough, chest tightness and shortness of breath.   Cardiovascular: Negative.  Negative for chest pain and palpitations.  Gastrointestinal:  Negative for abdominal pain, constipation, diarrhea, nausea and vomiting.  Genitourinary:  Negative for dysuria and frequency.  Musculoskeletal:  Negative for arthralgias, back pain, joint swelling and neck pain.  Skin:  Negative for rash.  Neurological: Negative.  Negative for tremors and numbness.  Hematological:  Negative for adenopathy. Does not bruise/bleed easily.  Psychiatric/Behavioral:  Negative for behavioral problems (Depression), sleep disturbance and suicidal ideas. The patient is not nervous/anxious.     Vital Signs: BP 130/80   Pulse 71   Temp 98.4 F (36.9 C)   Resp 16   Ht 5\' 4"  (1.626 m)   Wt 195 lb (88.5 kg)   SpO2 97%   BMI 33.47 kg/m    Physical Exam Vitals reviewed.  Constitutional:      General: She is not in acute distress.    Appearance: Normal appearance. She is obese. She is not ill-appearing.  HENT:     Head: Normocephalic and atraumatic.  Eyes:     Pupils: Pupils are equal, round, and reactive to light.  Cardiovascular:     Rate and Rhythm: Normal rate and regular rhythm.  Pulmonary:     Effort: Pulmonary effort is normal. No respiratory distress.  Neurological:     Mental Status: She is alert and oriented to person, place, and time.  Psychiatric:        Mood and Affect: Mood normal.        Behavior: Behavior normal.        Assessment/Plan: 1. Essential hypertension Stable, continue amlodipine as prescribed.  - amLODipine (NORVASC) 5 MG tablet; TAKE 1 TABLET(5 MG) BY MOUTH DAILY  Dispense: 90  tablet; Refill: 1  2. Mixed hyperlipidemia Continue lovastatin as prescribed.  - lovastatin (MEVACOR) 40 MG tablet; TAKE 1 TABLET(40 MG) BY MOUTH AT BEDTIME  Dispense: 90 tablet; Refill: 1  3. Need for vaccination - Tdap (BOOSTRIX) 5-2.5-18.5 LF-MCG/0.5 injection; Inject 0.5 mLs into the muscle once for 1 dose.  Dispense: 0.5 mL; Refill: 0   General Counseling: Nannette verbalizes understanding of the findings of todays visit and agrees with plan of treatment. I have discussed any further diagnostic evaluation that Patchin be needed or ordered today. We also reviewed her medications today. she has been encouraged to call the office with any questions or concerns that should arise related to todays visit.    No orders of the defined  types were placed in this encounter.   Meds ordered this encounter  Medications   Tdap (BOOSTRIX) 5-2.5-18.5 LF-MCG/0.5 injection    Sig: Inject 0.5 mLs into the muscle once for 1 dose.    Dispense:  0.5 mL    Refill:  0   amLODipine (NORVASC) 5 MG tablet    Sig: TAKE 1 TABLET(5 MG) BY MOUTH DAILY    Dispense:  90 tablet    Refill:  1    For future refills   lovastatin (MEVACOR) 40 MG tablet    Sig: TAKE 1 TABLET(40 MG) BY MOUTH AT BEDTIME    Dispense:  90 tablet    Refill:  1    For future refills    Return for previously scheduled, CPE, Adeel Guiffre PCP in december. .   Total time spent:30 Minutes Time spent includes review of chart, medications, test results, and follow up plan with the patient.   [place] Controlled Substance Database was reviewed by me.  This patient was seen by Sallyanne Kuster, FNP-C in collaboration with Dr. Beverely Risen as a part of collaborative care agreement.   Kanyon Seibold R. Tedd Sias, MSN, FNP-C Internal medicine

## 2023-03-06 ENCOUNTER — Other Ambulatory Visit: Payer: Self-pay

## 2023-03-06 ENCOUNTER — Inpatient Hospital Stay: Payer: 59 | Attending: Oncology

## 2023-03-06 DIAGNOSIS — D751 Secondary polycythemia: Secondary | ICD-10-CM | POA: Insufficient documentation

## 2023-03-06 DIAGNOSIS — E010 Iodine-deficiency related diffuse (endemic) goiter: Secondary | ICD-10-CM

## 2023-03-06 LAB — CBC WITH DIFFERENTIAL (CANCER CENTER ONLY)
Abs Immature Granulocytes: 0.02 10*3/uL (ref 0.00–0.07)
Basophils Absolute: 0 10*3/uL (ref 0.0–0.1)
Basophils Relative: 0 %
Eosinophils Absolute: 0.2 10*3/uL (ref 0.0–0.5)
Eosinophils Relative: 2 %
HCT: 46 % (ref 36.0–46.0)
Hemoglobin: 15 g/dL (ref 12.0–15.0)
Immature Granulocytes: 0 %
Lymphocytes Relative: 27 %
Lymphs Abs: 2 10*3/uL (ref 0.7–4.0)
MCH: 28.8 pg (ref 26.0–34.0)
MCHC: 32.6 g/dL (ref 30.0–36.0)
MCV: 88.3 fL (ref 80.0–100.0)
Monocytes Absolute: 0.6 10*3/uL (ref 0.1–1.0)
Monocytes Relative: 7 %
Neutro Abs: 4.8 10*3/uL (ref 1.7–7.7)
Neutrophils Relative %: 64 %
Platelet Count: 229 10*3/uL (ref 150–400)
RBC: 5.21 MIL/uL — ABNORMAL HIGH (ref 3.87–5.11)
RDW: 13.9 % (ref 11.5–15.5)
WBC Count: 7.6 10*3/uL (ref 4.0–10.5)
nRBC: 0 % (ref 0.0–0.2)

## 2023-03-06 NOTE — Addendum Note (Signed)
Addended by: Ashley Royalty A on: 03/06/2023 01:05 PM   Modules accepted: Orders

## 2023-03-10 LAB — JAK2 GENOTYPR

## 2023-03-15 LAB — MISC LABCORP TEST (SEND OUT)
LabCorp test name: 15
Labcorp test code: 489501

## 2023-04-28 ENCOUNTER — Other Ambulatory Visit: Payer: Self-pay | Admitting: Nurse Practitioner

## 2023-04-28 DIAGNOSIS — Z8673 Personal history of transient ischemic attack (TIA), and cerebral infarction without residual deficits: Secondary | ICD-10-CM

## 2023-07-04 ENCOUNTER — Other Ambulatory Visit: Payer: Self-pay | Admitting: *Deleted

## 2023-07-04 DIAGNOSIS — D751 Secondary polycythemia: Secondary | ICD-10-CM

## 2023-07-05 ENCOUNTER — Inpatient Hospital Stay (HOSPITAL_BASED_OUTPATIENT_CLINIC_OR_DEPARTMENT_OTHER): Payer: 59 | Admitting: Oncology

## 2023-07-05 ENCOUNTER — Encounter: Payer: Self-pay | Admitting: Oncology

## 2023-07-05 ENCOUNTER — Inpatient Hospital Stay: Payer: 59 | Attending: Oncology

## 2023-07-05 VITALS — BP 121/77 | HR 73 | Temp 97.2°F | Resp 18 | Ht 64.0 in | Wt 197.3 lb

## 2023-07-05 DIAGNOSIS — Z803 Family history of malignant neoplasm of breast: Secondary | ICD-10-CM | POA: Diagnosis not present

## 2023-07-05 DIAGNOSIS — D751 Secondary polycythemia: Secondary | ICD-10-CM

## 2023-07-05 LAB — CBC WITH DIFFERENTIAL (CANCER CENTER ONLY)
Abs Immature Granulocytes: 0.01 10*3/uL (ref 0.00–0.07)
Basophils Absolute: 0 10*3/uL (ref 0.0–0.1)
Basophils Relative: 0 %
Eosinophils Absolute: 0.2 10*3/uL (ref 0.0–0.5)
Eosinophils Relative: 2 %
HCT: 47.5 % — ABNORMAL HIGH (ref 36.0–46.0)
Hemoglobin: 15.6 g/dL — ABNORMAL HIGH (ref 12.0–15.0)
Immature Granulocytes: 0 %
Lymphocytes Relative: 29 %
Lymphs Abs: 2 10*3/uL (ref 0.7–4.0)
MCH: 28.9 pg (ref 26.0–34.0)
MCHC: 32.8 g/dL (ref 30.0–36.0)
MCV: 88 fL (ref 80.0–100.0)
Monocytes Absolute: 0.6 10*3/uL (ref 0.1–1.0)
Monocytes Relative: 8 %
Neutro Abs: 4.2 10*3/uL (ref 1.7–7.7)
Neutrophils Relative %: 61 %
Platelet Count: 239 10*3/uL (ref 150–400)
RBC: 5.4 MIL/uL — ABNORMAL HIGH (ref 3.87–5.11)
RDW: 14.1 % (ref 11.5–15.5)
WBC Count: 7 10*3/uL (ref 4.0–10.5)
nRBC: 0 % (ref 0.0–0.2)

## 2023-07-05 NOTE — Addendum Note (Signed)
Addended by: Corene Cornea on: 07/05/2023 04:05 PM   Modules accepted: Orders

## 2023-07-05 NOTE — Progress Notes (Signed)
Hematology/Oncology Consult note [facility]  [phone] [phone]  Patient Care Team: Sallyanne Kuster, NP as PCP - General (Nurse Practitioner) Lyndon Code, MD (Internal Medicine)   Name of the patient: Valerie Thornton  [id number]  May 12, 1956   Date of visit: 07/05/23  Diagnosis- Secondary polycythemia of unclear etiology   Chief complaint/ Reason for visit-routine follow-up of polycythemia  Heme/Onc history: Patient is a 67 year old female with a past medical history significant for hypertension migraines and possible TIA who has been referred for elevated hemoglobin.  CBC from 08/25/2022 showed H&H of 16.8/51.2 with a white count of 6.2 and platelets of 253.  Looking back at her prior CBCs her hemoglobin has been between 16-17 dating back to 2017 with no clear rising trend.   Patient is a lifelong non-smoker.  No baseline history of any chronic lung diseases.  Denies any family history of polycythemia.   Results of blood work from February 2024 showed normal EPO levels.  No evidence of JAK2 mutation.  Example mutation testing negative.  Urinalysis negative for hematuria.    Interval history-patient is doing well presently and denies any complaints at this time.  ECOG PS- 0 Pain scale- 0   Review of systems- Review of Systems  Constitutional:  Negative for chills, fever, malaise/fatigue and weight loss.  HENT:  Negative for congestion, ear discharge and nosebleeds.   Eyes:  Negative for blurred vision.  Respiratory:  Negative for cough, hemoptysis, sputum production, shortness of breath and wheezing.   Cardiovascular:  Negative for chest pain, palpitations, orthopnea and claudication.  Gastrointestinal:  Negative for abdominal pain, blood in stool, constipation, diarrhea, heartburn, melena, nausea and vomiting.  Genitourinary:  Negative for dysuria, flank pain, frequency, hematuria and urgency.  Musculoskeletal:  Negative for back  pain, joint pain and myalgias.  Skin:  Negative for rash.  Neurological:  Negative for dizziness, tingling, focal weakness, seizures, weakness and headaches.  Endo/Heme/Allergies:  Does not bruise/bleed easily.  Psychiatric/Behavioral:  Negative for depression and suicidal ideas. The patient does not have insomnia.       Allergies  Allergen Reactions   Sulfa Antibiotics Hives     Past Medical History:  Diagnosis Date   Erythrocytosis    Hypertension    Migraines    per pt it was hormonal and food caused, hardly anymore she has one   TIA (transient ischemic attack) 2015   affected right side but completely resolved     Past Surgical History:  Procedure Laterality Date   TONSILLECTOMY AND ADENOIDECTOMY     pt was 67 years old    Social History   Socioeconomic History   Marital status: Married    Spouse name: Not on file   Number of children: Not on file   Years of education: Not on file   Highest education level: Not on file  Occupational History   Not on file  Tobacco Use   Smoking status: Never   Smokeless tobacco: Never  Substance and Sexual Activity   Alcohol use: Yes    Comment: few times a month    Drug use: No   Sexual activity: Not Currently  Other Topics Concern   Not on file  Social History Narrative   Not on file   Social Determinants of Health   Financial Resource Strain: Not on file  Food Insecurity: No Food Insecurity (10/10/2022)   Hunger Vital Sign    Worried About Running Out of Food in the  Last Year: Never true    Ran Out of Food in the Last Year: Never true  Transportation Needs: No Transportation Needs (10/10/2022)   PRAPARE - Administrator, Civil Service (Medical): No    Lack of Transportation (Non-Medical): No  Physical Activity: Not on file  Stress: Not on file  Social Connections: Not on file  Intimate Partner Violence: Not At Risk (10/10/2022)   Humiliation, Afraid, Rape, and Kick questionnaire    Fear of Current or  Ex-Partner: No    Emotionally Abused: No    Physically Abused: No    Sexually Abused: No    Family History  Problem Relation Age of Onset   Breast cancer Maternal Aunt 45   Hypertension Mother    Hypertension Sister      Current Outpatient Medications:    amLODipine (NORVASC) 5 MG tablet, TAKE 1 TABLET(5 MG) BY MOUTH DAILY, Disp: 90 tablet, Rfl: 1   benazepril (LOTENSIN) 5 MG tablet, TAKE 1 TABLET(5 MG) BY MOUTH DAILY, Disp: 90 tablet, Rfl: 1   clopidogrel (PLAVIX) 75 MG tablet, TAKE 1 TABLET(75 MG) BY MOUTH DAILY, Disp: 90 tablet, Rfl: 1   clotrimazole-betamethasone (LOTRISONE) cream, Apply 1 application topically 2 (two) times daily., Disp: 45 g, Rfl: 2   lovastatin (MEVACOR) 40 MG tablet, TAKE 1 TABLET(40 MG) BY MOUTH AT BEDTIME, Disp: 90 tablet, Rfl: 1  Physical exam:  Vitals:   07/05/23 1509  BP: 121/77  Pulse: 73  Resp: 18  Temp: (!) 97.2 F (36.2 C)  TempSrc: Tympanic  SpO2: 99%  Weight: 197 lb 4.8 oz (89.5 kg)  Height: 5\' 4"  (1.626 m)   Physical Exam Cardiovascular:     Rate and Rhythm: Normal rate and regular rhythm.     Heart sounds: Normal heart sounds.  Pulmonary:     Effort: Pulmonary effort is normal.     Breath sounds: Normal breath sounds.  Skin:    General: Skin is warm and dry.  Neurological:     Mental Status: She is alert and oriented to person, place, and time.         Latest Ref Rng & Units 02/04/2022    7:59 AM  CMP  Glucose 70 - 99 mg/dL 409   BUN 8 - 27 mg/dL 14   Creatinine 8.11 - 1.00 mg/dL 9.14   Sodium 782 - 956 mmol/L 143   Potassium 3.5 - 5.2 mmol/L 4.6   Chloride 96 - 106 mmol/L 103   CO2 20 - 29 mmol/L 25   Calcium 8.7 - 10.3 mg/dL 9.5   Total Protein 6.0 - 8.5 g/dL 6.7   Total Bilirubin 0.0 - 1.2 mg/dL 0.6   Alkaline Phos 44 - 121 IU/L 93   AST 0 - 40 IU/L 15   ALT 0 - 32 IU/L 20       Latest Ref Rng & Units 07/05/2023    2:36 PM  CBC  WBC 4.0 - 10.5 K/uL 7.0   Hemoglobin 12.0 - 15.0 g/dL 21.3   Hematocrit 08.6 -  46.0 % 47.5   Platelets 150 - 400 K/uL 239     No images are attached to the encounter.  No results found.   Assessment and plan- Patient is a 67 y.o. female here for routine follow-up of secondary polycythemia  Workup for primary polycythemia vera including JAK2 mutation and EPO levels were unremarkable.  Her hemoglobin fluctuates between 15-17 and over the last 7 months it has been around 15.  Given that her hematocrit is less than 55 she does not require any phlebotomy at this time.  I will repeat CBC in 6 months in 1 year and see her back in 1 year   Visit Diagnosis 1. Polycythemia, secondary      Dr. Owens Shark, MD, MPH [facility] at [facility] [phone] 07/05/2023 3:45 PM

## 2023-07-11 ENCOUNTER — Other Ambulatory Visit: Payer: Self-pay | Admitting: Nurse Practitioner

## 2023-07-11 DIAGNOSIS — Z1231 Encounter for screening mammogram for malignant neoplasm of breast: Secondary | ICD-10-CM

## 2023-07-26 ENCOUNTER — Other Ambulatory Visit: Payer: Self-pay | Admitting: Nurse Practitioner

## 2023-07-26 DIAGNOSIS — I10 Essential (primary) hypertension: Secondary | ICD-10-CM

## 2023-08-14 ENCOUNTER — Encounter: Payer: 59 | Admitting: Nurse Practitioner

## 2023-09-05 ENCOUNTER — Ambulatory Visit
Admission: RE | Admit: 2023-09-05 | Discharge: 2023-09-05 | Disposition: A | Discharge status: Home / Self Care | Payer: 59 | Source: Ambulatory Visit | Attending: Nurse Practitioner | Admitting: Nurse Practitioner

## 2023-09-05 DIAGNOSIS — Z1231 Encounter for screening mammogram for malignant neoplasm of breast: Secondary | ICD-10-CM | POA: Insufficient documentation

## 2023-09-11 ENCOUNTER — Encounter: Payer: Self-pay | Admitting: Nurse Practitioner

## 2023-09-11 ENCOUNTER — Ambulatory Visit (INDEPENDENT_AMBULATORY_CARE_PROVIDER_SITE_OTHER): Payer: 59 | Admitting: Nurse Practitioner

## 2023-09-11 VITALS — BP 126/76 | HR 80 | Temp 98.3°F | Resp 16 | Ht 64.0 in | Wt 198.2 lb

## 2023-09-11 DIAGNOSIS — I10 Essential (primary) hypertension: Secondary | ICD-10-CM | POA: Diagnosis not present

## 2023-09-11 DIAGNOSIS — Z8673 Personal history of transient ischemic attack (TIA), and cerebral infarction without residual deficits: Secondary | ICD-10-CM | POA: Diagnosis not present

## 2023-09-11 DIAGNOSIS — D751 Secondary polycythemia: Secondary | ICD-10-CM

## 2023-09-11 DIAGNOSIS — E782 Mixed hyperlipidemia: Secondary | ICD-10-CM

## 2023-09-11 DIAGNOSIS — E010 Iodine-deficiency related diffuse (endemic) goiter: Secondary | ICD-10-CM

## 2023-09-11 DIAGNOSIS — Z0001 Encounter for general adult medical examination with abnormal findings: Secondary | ICD-10-CM | POA: Diagnosis not present

## 2023-09-11 MED ORDER — AMLODIPINE BESYLATE 5 MG PO TABS: ORAL_TABLET | ORAL | 1 refills | Status: DC

## 2023-09-11 MED ORDER — LOVASTATIN 40 MG PO TABS: ORAL_TABLET | ORAL | 1 refills | Status: DC

## 2023-09-11 MED ORDER — CLOPIDOGREL BISULFATE 75 MG PO TABS: 75.0000 mg | ORAL_TABLET | Freq: Every day | ORAL | 1 refills | Status: DC

## 2023-09-11 NOTE — Progress Notes (Signed)
 [facility] [address] [place], [place] [address]  Internal MEDICINE  Office Visit Note  Patient Name: Valerie Thornton  [id number]  [id number]  Date of Service: 09/11/2023  Chief Complaint  Patient presents with   Annual Exam   Hypertension    HPI Valerie Thornton presents for an annual well visit and physical exam.  Well-appearing 68 y.o. female with hypertension, high cholesterol, thyromegaly, prediabetes, low vitamin D , osteoarthritis and history of TIA Routine CRC screening: cologuard due in January 2027 Routine mammogram: done last week, due in 12 months  DEXA scan: was normal, done in 2022  Eye exam and/or foot exam: Labs: due for routine labs  New or worsening pain: none  Other concerns: none    Current Medication: Outpatient Encounter Medications as of 09/11/2023  Medication Sig   benazepril  (LOTENSIN ) 5 MG tablet TAKE 1 TABLET(5 MG) BY MOUTH DAILY   clotrimazole -betamethasone  (LOTRISONE ) cream Apply 1 application topically 2 (two) times daily.   [DISCONTINUED] amLODipine  (NORVASC ) 5 MG tablet TAKE 1 TABLET(5 MG) BY MOUTH DAILY   [DISCONTINUED] clopidogrel  (PLAVIX ) 75 MG tablet TAKE 1 TABLET(75 MG) BY MOUTH DAILY   [DISCONTINUED] lovastatin  (MEVACOR ) 40 MG tablet TAKE 1 TABLET(40 MG) BY MOUTH AT BEDTIME   amLODipine  (NORVASC ) 5 MG tablet TAKE 1 TABLET(5 MG) BY MOUTH DAILY   clopidogrel  (PLAVIX ) 75 MG tablet Take 1 tablet (75 mg total) by mouth daily.   lovastatin  (MEVACOR ) 40 MG tablet TAKE 1 TABLET(40 MG) BY MOUTH AT BEDTIME   No facility-administered encounter medications on file as of 09/11/2023.    Surgical History: Past Surgical History:  Procedure Laterality Date   TONSILLECTOMY AND ADENOIDECTOMY     pt was 68 years old    Medical History: Past Medical History:  Diagnosis Date   Erythrocytosis    Hypertension    Migraines    per pt it was hormonal and food caused, hardly anymore she has one   TIA (transient ischemic attack) 2015   affected right  side but completely resolved    Family History: Family History  Problem Relation Age of Onset   Breast cancer Maternal Aunt 28   Hypertension Mother    Hypertension Sister     Social History   Socioeconomic History   Marital status: Married    Spouse name: Not on file   Number of children: Not on file   Years of education: Not on file   Highest education level: Not on file  Occupational History   Not on file  Tobacco Use   Smoking status: Never   Smokeless tobacco: Never  Substance and Sexual Activity   Alcohol use: Yes    Comment: few times a month    Drug use: No   Sexual activity: Not Currently  Other Topics Concern   Not on file  Social History Narrative   Not on file   Social Drivers of Health   Financial Resource Strain: Not on file  Food Insecurity: No Food Insecurity (10/10/2022)   Hunger Vital Sign    Worried About Running Out of Food in the Last Year: Never true    Ran Out of Food in the Last Year: Never true  Transportation Needs: No Transportation Needs (10/10/2022)   PRAPARE - Administrator, Civil Service (Medical): No    Lack of Transportation (Non-Medical): No  Physical Activity: Not on file  Stress: Not on file  Social Connections: Not on file  Intimate Partner Violence: Not At Risk (10/10/2022)  Humiliation, Afraid, Rape, and Kick questionnaire    Fear of Current or Ex-Partner: No    Emotionally Abused: No    Physically Abused: No    Sexually Abused: No      Review of Systems  Constitutional:  Negative for activity change, appetite change, chills, fatigue, fever and unexpected weight change.  HENT: Negative.  Negative for congestion, ear pain, rhinorrhea, sore throat and trouble swallowing.   Eyes: Negative.   Respiratory: Negative.  Negative for cough, chest tightness, shortness of breath and wheezing.   Cardiovascular: Negative.  Negative for chest pain.  Gastrointestinal: Negative.  Negative for abdominal pain, blood in  stool, constipation, diarrhea, nausea and vomiting.  Endocrine: Negative.   Genitourinary: Negative.  Negative for difficulty urinating, dysuria, frequency, hematuria and urgency.  Musculoskeletal: Negative.  Negative for arthralgias, back pain, joint swelling, myalgias and neck pain.  Skin: Negative.  Negative for rash and wound.  Allergic/Immunologic: Negative.  Negative for immunocompromised state.  Neurological: Negative.  Negative for dizziness, seizures, numbness and headaches.  Hematological: Negative.   Psychiatric/Behavioral: Negative.  Negative for behavioral problems, self-injury and suicidal ideas. The patient is not nervous/anxious.     Vital Signs: BP 126/76   Pulse 80   Temp 98.3 F (36.8 C)   Resp 16   Ht 5' 4 (1.626 m)   Wt 198 lb 3.2 oz (89.9 kg)   SpO2 97%   BMI 34.02 kg/m    Physical Exam Vitals reviewed.  Constitutional:      General: She is awake. She is not in acute distress.    Appearance: Normal appearance. She is well-developed and well-groomed. She is obese. She is not ill-appearing or diaphoretic.  HENT:     Head: Normocephalic and atraumatic.     Right Ear: Tympanic membrane, ear canal and external ear normal.     Left Ear: Tympanic membrane, ear canal and external ear normal.     Nose: Nose normal. No congestion or rhinorrhea.     Mouth/Throat:     Lips: Pink.     Mouth: Mucous membranes are moist.     Pharynx: Oropharynx is clear. Uvula midline. No oropharyngeal exudate or posterior oropharyngeal erythema.  Eyes:     General: Lids are normal. Vision grossly intact. Gaze aligned appropriately. No scleral icterus.       Right eye: No discharge.        Left eye: No discharge.     Extraocular Movements: Extraocular movements intact.     Conjunctiva/sclera: Conjunctivae normal.     Pupils: Pupils are equal, round, and reactive to light.     Funduscopic exam:    Right eye: Red reflex present.        Left eye: Red reflex present. Neck:      Thyroid : No thyromegaly.     Vascular: No JVD.     Trachea: Trachea and phonation normal. No tracheal deviation.  Cardiovascular:     Rate and Rhythm: Normal rate and regular rhythm.     Heart sounds: Normal heart sounds, S1 normal and S2 normal. No murmur heard.    No friction rub. No gallop.  Pulmonary:     Effort: Pulmonary effort is normal. No accessory muscle usage or respiratory distress.     Breath sounds: Normal breath sounds and air entry. No stridor. No wheezing or rales.  Chest:     Chest wall: No tenderness.     Comments: Declined clinical breast exam, gets annual mammograms, most recent one was  normal. Abdominal:     General: Bowel sounds are normal. There is no distension.     Palpations: Abdomen is soft. There is no shifting dullness, fluid wave, mass or pulsatile mass.     Tenderness: There is no abdominal tenderness. There is no guarding or rebound.  Musculoskeletal:        General: No tenderness or deformity. Normal range of motion.     Cervical back: Normal range of motion and neck supple.     Right lower leg: No edema.     Left lower leg: No edema.  Lymphadenopathy:     Cervical: No cervical adenopathy.  Skin:    General: Skin is warm and dry.     Coloration: Skin is not pale.     Findings: No erythema or rash.  Neurological:     Mental Status: She is alert and oriented to person, place, and time.     Cranial Nerves: No cranial nerve deficit.     Motor: No abnormal muscle tone.     Coordination: Coordination normal.     Gait: Gait normal.     Deep Tendon Reflexes: Reflexes are normal and symmetric.  Psychiatric:        Mood and Affect: Mood normal.        Behavior: Behavior normal. Behavior is cooperative.        Thought Content: Thought content normal.        Judgment: Judgment normal.        Assessment/Plan: 1. Encounter for routine adult health examination with abnormal findings (Primary) Age-appropriate preventive screenings and vaccinations  discussed, annual physical exam completed. Routine labs for health maintenance will be ordered, requisition form given to patient. PHM updated.    2. Essential hypertension Continue amlodipine  as prescribed, refills ordered  - amLODipine  (NORVASC ) 5 MG tablet; TAKE 1 TABLET(5 MG) BY MOUTH DAILY  Dispense: 90 tablet; Refill: 1  3. Mixed hyperlipidemia Continue lovastatin  as prescribed, refills ordered  - lovastatin  (MEVACOR ) 40 MG tablet; TAKE 1 TABLET(40 MG) BY MOUTH AT BEDTIME  Dispense: 90 tablet; Refill: 1  4. History of TIAs Continue plavix  as prescribed, refills ordered  - clopidogrel  (PLAVIX ) 75 MG tablet; Take 1 tablet (75 mg total) by mouth daily.  Dispense: 90 tablet; Refill: 1    General Counseling: kenyona rena understanding of the findings of todays visit and agrees with plan of treatment. I have discussed any further diagnostic evaluation that Beam be needed or ordered today. We also reviewed her medications today. she has been encouraged to call the office with any questions or concerns that should arise related to todays visit.    No orders of the defined types were placed in this encounter.   Meds ordered this encounter  Medications   clopidogrel  (PLAVIX ) 75 MG tablet    Sig: Take 1 tablet (75 mg total) by mouth daily.    Dispense:  90 tablet    Refill:  1    For future refills, keep on file.   amLODipine  (NORVASC ) 5 MG tablet    Sig: TAKE 1 TABLET(5 MG) BY MOUTH DAILY    Dispense:  90 tablet    Refill:  1    For future refills   lovastatin  (MEVACOR ) 40 MG tablet    Sig: TAKE 1 TABLET(40 MG) BY MOUTH AT BEDTIME    Dispense:  90 tablet    Refill:  1    For future refills    Return in about 6 months (around 03/10/2024)  for F/U, Emalea Mix PCP.   Total time spent:30 Minutes Time spent includes review of chart, medications, test results, and follow up plan with the patient.   [place] Controlled Substance Database was reviewed by me.  This patient was seen by  Mardy Maxin, FNP-C in collaboration with Dr. Sigrid Bathe as a part of collaborative care agreement.  Lasasha Brophy R. Maxin, MSN, FNP-C Internal medicine

## 2023-09-12 ENCOUNTER — Encounter: Payer: Self-pay | Admitting: Nurse Practitioner

## 2023-09-25 ENCOUNTER — Other Ambulatory Visit: Payer: Self-pay | Admitting: Nurse Practitioner

## 2023-09-26 LAB — CBC WITH DIFFERENTIAL/PLATELET
Basophils Absolute: 0 10*3/uL (ref 0.0–0.2)
Basos: 0 %
EOS (ABSOLUTE): 0.2 10*3/uL (ref 0.0–0.4)
Eos: 3 %
Hematocrit: 50.4 % — ABNORMAL HIGH (ref 34.0–46.6)
Hemoglobin: 16.6 g/dL — ABNORMAL HIGH (ref 11.1–15.9)
Immature Grans (Abs): 0 10*3/uL (ref 0.0–0.1)
Immature Granulocytes: 0 %
Lymphocytes Absolute: 1.7 10*3/uL (ref 0.7–3.1)
Lymphs: 25 %
MCH: 28.8 pg (ref 26.6–33.0)
MCHC: 32.9 g/dL (ref 31.5–35.7)
MCV: 88 fL (ref 79–97)
Monocytes Absolute: 0.5 10*3/uL (ref 0.1–0.9)
Monocytes: 7 %
Neutrophils Absolute: 4.4 10*3/uL (ref 1.4–7.0)
Neutrophils: 65 %
Platelets: 257 10*3/uL (ref 150–450)
RBC: 5.76 x10E6/uL — ABNORMAL HIGH (ref 3.77–5.28)
RDW: 13.6 % (ref 11.7–15.4)
WBC: 6.9 10*3/uL (ref 3.4–10.8)

## 2023-09-26 LAB — FERRITIN: Ferritin: 106 ng/mL (ref 15–150)

## 2023-09-26 LAB — HGB A1C W/O EAG: Hgb A1c MFr Bld: 6.1 % — ABNORMAL HIGH (ref 4.8–5.6)

## 2023-09-26 LAB — IRON AND TIBC
Iron Saturation: 28 % (ref 15–55)
Iron: 99 ug/dL (ref 27–139)
Total Iron Binding Capacity: 359 ug/dL (ref 250–450)
UIBC: 260 ug/dL (ref 118–369)

## 2023-09-26 LAB — COMPREHENSIVE METABOLIC PANEL
ALT: 21 [IU]/L (ref 0–32)
AST: 22 [IU]/L (ref 0–40)
Albumin: 4.3 g/dL (ref 3.9–4.9)
Alkaline Phosphatase: 111 [IU]/L (ref 44–121)
BUN/Creatinine Ratio: 14 (ref 12–28)
BUN: 11 mg/dL (ref 8–27)
Bilirubin Total: 0.5 mg/dL (ref 0.0–1.2)
CO2: 23 mmol/L (ref 20–29)
Calcium: 9.4 mg/dL (ref 8.7–10.3)
Chloride: 102 mmol/L (ref 96–106)
Creatinine, Ser: 0.76 mg/dL (ref 0.57–1.00)
Globulin, Total: 2.3 g/dL (ref 1.5–4.5)
Glucose: 107 mg/dL — ABNORMAL HIGH (ref 70–99)
Potassium: 4.4 mmol/L (ref 3.5–5.2)
Sodium: 143 mmol/L (ref 134–144)
Total Protein: 6.6 g/dL (ref 6.0–8.5)
eGFR: 86 mL/min/{1.73_m2} (ref >59)

## 2023-09-26 LAB — LIPID PANEL
Chol/HDL Ratio: 3.2 {ratio} (ref 0.0–4.4)
Cholesterol, Total: 205 mg/dL — ABNORMAL HIGH (ref 100–199)
HDL: 65 mg/dL (ref >39)
LDL Chol Calc (NIH): 107 mg/dL — ABNORMAL HIGH (ref 0–99)
Triglycerides: 195 mg/dL — ABNORMAL HIGH (ref 0–149)
VLDL Cholesterol Cal: 33 mg/dL (ref 5–40)

## 2023-09-26 LAB — TSH: TSH: 0.506 u[IU]/mL (ref 0.450–4.500)

## 2023-09-26 LAB — B12 AND FOLATE PANEL
Folate: >20 ng/mL (ref >3.0)
Vitamin B-12: 1011 pg/mL (ref 232–1245)

## 2023-09-26 LAB — VITAMIN D 25 HYDROXY (VIT D DEFICIENCY, FRACTURES): Vit D, 25-Hydroxy: 39.1 ng/mL (ref 30.0–100.0)

## 2023-09-26 LAB — T4, FREE: Free T4: 1.08 ng/dL (ref 0.82–1.77)

## 2023-10-17 NOTE — Progress Notes (Signed)
 A1c is stable, still prediabetic. Triglycerides increased significantly, if not taking a fish oil supplement, this would be something OTC to add that can decrease the triglycerides.  We will repeat the cholesterol panel in July and if no improvement or worsening of the triglycerides, Valerie Thornton need to add a small dose of fenofibrate to bring the level down.

## 2023-10-20 ENCOUNTER — Telehealth: Payer: Self-pay

## 2023-10-20 NOTE — Telephone Encounter (Signed)
-----   Message from [facility] sent at 10/17/2023  8:57 AM EST ----- A1c is stable, still prediabetic. Triglycerides increased significantly, if not taking a fish oil supplement, this would be something OTC to add that can decrease the triglycerides.  We will repeat the cholesterol panel in July and if no improvement or worsening of the triglycerides, Lorge need to add a small dose of fenofibrate to bring the level down.

## 2023-10-20 NOTE — Telephone Encounter (Signed)
 Patient notified

## 2023-12-07 ENCOUNTER — Encounter: Payer: Self-pay | Admitting: Physician Assistant

## 2023-12-07 ENCOUNTER — Ambulatory Visit (INDEPENDENT_AMBULATORY_CARE_PROVIDER_SITE_OTHER): Admitting: Physician Assistant

## 2023-12-07 VITALS — BP 122/80 | HR 95 | Temp 98.5°F | Resp 16 | Ht 64.0 in | Wt 199.0 lb

## 2023-12-07 DIAGNOSIS — R319 Hematuria, unspecified: Secondary | ICD-10-CM | POA: Diagnosis not present

## 2023-12-07 DIAGNOSIS — R300 Dysuria: Secondary | ICD-10-CM

## 2023-12-07 DIAGNOSIS — N390 Urinary tract infection, site not specified: Secondary | ICD-10-CM | POA: Diagnosis not present

## 2023-12-07 LAB — POCT URINALYSIS DIPSTICK
Bilirubin, UA: NEGATIVE
Blood, UA: POSITIVE
Glucose, UA: NEGATIVE
Ketones, UA: POSITIVE
Nitrite, UA: NEGATIVE
Protein, UA: POSITIVE — AB
Spec Grav, UA: 1.01 (ref 1.010–1.025)
Urobilinogen, UA: 0.2 U/dL
pH, UA: 7 (ref 5.0–8.0)

## 2023-12-07 MED ORDER — CIPROFLOXACIN HCL 500 MG PO TABS: 500.0000 mg | ORAL_TABLET | Freq: Two times a day (BID) | ORAL | 0 refills | Status: AC

## 2023-12-07 NOTE — Progress Notes (Signed)
 [facility] [address] [place], [place] [address]  Internal MEDICINE  Office Visit Note  Patient Name: Valerie Thornton  [id number]  [id number]  Date of Service: 12/07/2023  Chief Complaint  Patient presents with   Acute Visit   Urinary Tract Infection     HPI Pt is here for a sick visit. -back hurting for a few days, but then burning with urination started yesterday -Some urgency, frequency. Redner have seen small amount of blood in urine yesterday -has had UTI before but has been many years  Current Medication:  Outpatient Encounter Medications as of 12/07/2023  Medication Sig   amLODipine (NORVASC) 5 MG tablet TAKE 1 TABLET(5 MG) BY MOUTH DAILY   benazepril (LOTENSIN) 5 MG tablet TAKE 1 TABLET(5 MG) BY MOUTH DAILY   ciprofloxacin (CIPRO) 500 MG tablet Take 1 tablet (500 mg total) by mouth 2 (two) times daily for 7 days.   clopidogrel (PLAVIX) 75 MG tablet Take 1 tablet (75 mg total) by mouth daily.   clotrimazole-betamethasone (LOTRISONE) cream Apply 1 application topically 2 (two) times daily.   lovastatin (MEVACOR) 40 MG tablet TAKE 1 TABLET(40 MG) BY MOUTH AT BEDTIME   No facility-administered encounter medications on file as of 12/07/2023.      Medical History: Past Medical History:  Diagnosis Date   Erythrocytosis    Hypertension    Migraines    per pt it was hormonal and food caused, hardly anymore she has one   TIA (transient ischemic attack) 2015   affected right side but completely resolved     Vital Signs: BP 122/80   Pulse 95   Temp 98.5 F (36.9 C)   Resp 16   Ht 5\' 4"  (1.626 m)   Wt 199 lb (90.3 kg)   SpO2 96%   BMI 34.16 kg/m    Review of Systems  Constitutional:  Negative for fatigue and fever.  HENT:  Negative for congestion, mouth sores and postnasal drip.   Respiratory:  Negative for cough.   Cardiovascular:  Negative for chest pain.  Genitourinary:  Positive for dysuria, frequency and urgency.  Musculoskeletal:   Positive for back pain.  Psychiatric/Behavioral: Negative.      Physical Exam Vitals reviewed.  Constitutional:      General: She is not in acute distress.    Appearance: Normal appearance. She is obese. She is not ill-appearing.  HENT:     Head: Normocephalic and atraumatic.  Cardiovascular:     Rate and Rhythm: Normal rate and regular rhythm.  Pulmonary:     Effort: Pulmonary effort is normal. No respiratory distress.  Abdominal:     Tenderness: There is no right CVA tenderness or left CVA tenderness.  Neurological:     Mental Status: She is alert and oriented to person, place, and time.  Psychiatric:        Mood and Affect: Mood normal.        Behavior: Behavior normal.       Assessment/Plan: 1. Urinary tract infection with hematuria, site unspecified (Primary) Will start cipro and adjust based on C/S. Advised to drink plenty of water - CULTURE, URINE COMPREHENSIVE - ciprofloxacin (CIPRO) 500 MG tablet; Take 1 tablet (500 mg total) by mouth 2 (two) times daily for 7 days.  Dispense: 14 tablet; Refill: 0  2. Dysuria - POCT Urinalysis Dipstick    General Counseling: makaylah oddo understanding of the findings of todays visit and agrees with plan of treatment. I have discussed any further  diagnostic evaluation that [place] be needed or ordered today. We also reviewed her medications today. she has been encouraged to call the office with any questions or concerns that should arise related to todays visit.    Counseling:    Orders Placed This Encounter  Procedures   CULTURE, URINE COMPREHENSIVE   POCT Urinalysis Dipstick    Meds ordered this encounter  Medications   ciprofloxacin (CIPRO) 500 MG tablet    Sig: Take 1 tablet (500 mg total) by mouth 2 (two) times daily for 7 days.    Dispense:  14 tablet    Refill:  0    Time spent:25 Minutes

## 2023-12-10 LAB — CULTURE, URINE COMPREHENSIVE

## 2024-01-02 ENCOUNTER — Inpatient Hospital Stay: Payer: 59 | Attending: Oncology

## 2024-01-02 DIAGNOSIS — D751 Secondary polycythemia: Secondary | ICD-10-CM | POA: Insufficient documentation

## 2024-01-02 LAB — CBC WITH DIFFERENTIAL/PLATELET
Abs Immature Granulocytes: 0.02 10*3/uL (ref 0.00–0.07)
Basophils Absolute: 0.1 10*3/uL (ref 0.0–0.1)
Basophils Relative: 1 %
Eosinophils Absolute: 0.2 10*3/uL (ref 0.0–0.5)
Eosinophils Relative: 3 %
HCT: 47.6 % — ABNORMAL HIGH (ref 36.0–46.0)
Hemoglobin: 15.6 g/dL — ABNORMAL HIGH (ref 12.0–15.0)
Immature Granulocytes: 0 %
Lymphocytes Relative: 28 %
Lymphs Abs: 2 10*3/uL (ref 0.7–4.0)
MCH: 28.9 pg (ref 26.0–34.0)
MCHC: 32.8 g/dL (ref 30.0–36.0)
MCV: 88.3 fL (ref 80.0–100.0)
Monocytes Absolute: 0.6 10*3/uL (ref 0.1–1.0)
Monocytes Relative: 8 %
Neutro Abs: 4.4 10*3/uL (ref 1.7–7.7)
Neutrophils Relative %: 60 %
Platelets: 239 10*3/uL (ref 150–400)
RBC: 5.39 MIL/uL — ABNORMAL HIGH (ref 3.87–5.11)
RDW: 14.1 % (ref 11.5–15.5)
WBC: 7.3 10*3/uL (ref 4.0–10.5)
nRBC: 0 % (ref 0.0–0.2)

## 2024-01-17 ENCOUNTER — Other Ambulatory Visit: Payer: Self-pay | Admitting: Nurse Practitioner

## 2024-01-17 DIAGNOSIS — I10 Essential (primary) hypertension: Secondary | ICD-10-CM

## 2024-03-11 ENCOUNTER — Ambulatory Visit (INDEPENDENT_AMBULATORY_CARE_PROVIDER_SITE_OTHER): Payer: 59 | Admitting: Nurse Practitioner

## 2024-03-11 ENCOUNTER — Encounter: Payer: Self-pay | Admitting: Nurse Practitioner

## 2024-03-11 VITALS — BP 124/72 | HR 95 | Temp 98.2°F | Resp 16 | Ht 64.0 in | Wt 200.6 lb

## 2024-03-11 DIAGNOSIS — E538 Deficiency of other specified B group vitamins: Secondary | ICD-10-CM

## 2024-03-11 DIAGNOSIS — E010 Iodine-deficiency related diffuse (endemic) goiter: Secondary | ICD-10-CM

## 2024-03-11 DIAGNOSIS — R7303 Prediabetes: Secondary | ICD-10-CM | POA: Diagnosis not present

## 2024-03-11 DIAGNOSIS — Z8673 Personal history of transient ischemic attack (TIA), and cerebral infarction without residual deficits: Secondary | ICD-10-CM | POA: Diagnosis not present

## 2024-03-11 DIAGNOSIS — E042 Nontoxic multinodular goiter: Secondary | ICD-10-CM

## 2024-03-11 DIAGNOSIS — E559 Vitamin D deficiency, unspecified: Secondary | ICD-10-CM

## 2024-03-11 DIAGNOSIS — D751 Secondary polycythemia: Secondary | ICD-10-CM

## 2024-03-11 DIAGNOSIS — E782 Mixed hyperlipidemia: Secondary | ICD-10-CM | POA: Diagnosis not present

## 2024-03-11 DIAGNOSIS — I10 Essential (primary) hypertension: Secondary | ICD-10-CM

## 2024-03-11 MED ORDER — LOVASTATIN 40 MG PO TABS: ORAL_TABLET | ORAL | 1 refills | Status: DC

## 2024-03-11 MED ORDER — CLOPIDOGREL BISULFATE 75 MG PO TABS: 75.0000 mg | ORAL_TABLET | Freq: Every day | ORAL | 1 refills | Status: DC

## 2024-03-11 MED ORDER — AMLODIPINE BESYLATE 5 MG PO TABS: ORAL_TABLET | ORAL | 1 refills | Status: DC

## 2024-03-11 NOTE — Progress Notes (Signed)
 [facility] [address] [place], [place] [address]  Internal MEDICINE  Office Visit Note  Patient Name: Valerie Thornton  [id number]  [id number]  Date of Service: 03/11/2024  Chief Complaint  Patient presents with   Hypertension   Follow-up    HPI Valerie Thornton presents for a follow-up visit for hypertension, high cholesterol, prediabetes and history of TIA.  Hypertension -- controlled on amlodipine  and benazepril   High cholesterol -- taking lovastatin  daily.  History of TIA -- on plavix  for prevention  Prediabetes -- monitoring A1c twice a year.     Current Medication: Outpatient Encounter Medications as of 03/11/2024  Medication Sig   amLODipine  (NORVASC ) 5 MG tablet TAKE 1 TABLET(5 MG) BY MOUTH DAILY   benazepril  (LOTENSIN ) 5 MG tablet TAKE 1 TABLET(5 MG) BY MOUTH DAILY   clopidogrel  (PLAVIX ) 75 MG tablet Take 1 tablet (75 mg total) by mouth daily.   clotrimazole -betamethasone  (LOTRISONE ) cream Apply 1 application topically 2 (two) times daily.   lovastatin  (MEVACOR ) 40 MG tablet TAKE 1 TABLET(40 MG) BY MOUTH AT BEDTIME   [DISCONTINUED] amLODipine  (NORVASC ) 5 MG tablet TAKE 1 TABLET(5 MG) BY MOUTH DAILY   [DISCONTINUED] clopidogrel  (PLAVIX ) 75 MG tablet Take 1 tablet (75 mg total) by mouth daily.   [DISCONTINUED] lovastatin  (MEVACOR ) 40 MG tablet TAKE 1 TABLET(40 MG) BY MOUTH AT BEDTIME   No facility-administered encounter medications on file as of 03/11/2024.    Surgical History: Past Surgical History:  Procedure Laterality Date   TONSILLECTOMY AND ADENOIDECTOMY     pt was 68 years old    Medical History: Past Medical History:  Diagnosis Date   Erythrocytosis    Hypertension    Migraines    per pt it was hormonal and food caused, hardly anymore Valerie Thornton has one   TIA (transient ischemic attack) 2015   affected right side but completely resolved    Family History: Family History  Problem Relation Age of Onset   Breast cancer Maternal Aunt 51   Hypertension  Mother    Hypertension Sister     Social History   Socioeconomic History   Marital status: Married    Spouse name: Not on file   Number of children: Not on file   Years of education: Not on file   Highest education level: Not on file  Occupational History   Not on file  Tobacco Use   Smoking status: Never   Smokeless tobacco: Never  Substance and Sexual Activity   Alcohol use: Yes    Comment: few times a month    Drug use: No   Sexual activity: Not Currently  Other Topics Concern   Not on file  Social History Narrative   Not on file   Social Drivers of Health   Financial Resource Strain: Not on file  Food Insecurity: No Food Insecurity (10/10/2022)   Hunger Vital Sign    Worried About Running Out of Food in the Last Year: Never true    Ran Out of Food in the Last Year: Never true  Transportation Needs: No Transportation Needs (10/10/2022)   PRAPARE - Administrator, Civil Service (Medical): No    Lack of Transportation (Non-Medical): No  Physical Activity: Not on file  Stress: Not on file  Social Connections: Not on file  Intimate Partner Violence: Not At Risk (10/10/2022)   Humiliation, Afraid, Rape, and Kick questionnaire    Fear of Current or Ex-Partner: No    Emotionally Abused: No  Physically Abused: No    Sexually Abused: No      Review of Systems  Constitutional: Negative.  Negative for chills, fatigue and unexpected weight change.  HENT: Negative.  Negative for congestion, rhinorrhea, sneezing and sore throat.   Eyes:  Negative for redness.  Respiratory: Negative.  Negative for cough, chest tightness and shortness of breath.   Cardiovascular: Negative.  Negative for chest pain and palpitations.  Gastrointestinal:  Negative for abdominal pain, constipation, diarrhea, nausea and vomiting.  Genitourinary:  Negative for dysuria and frequency.  Musculoskeletal:  Negative for arthralgias, back pain, joint swelling and neck pain.  Skin:  Negative  for rash.  Neurological: Negative.  Negative for tremors and numbness.  Hematological:  Negative for adenopathy. Does not bruise/bleed easily.  Psychiatric/Behavioral:  Negative for behavioral problems (Depression), sleep disturbance and suicidal ideas. The patient is not nervous/anxious.     Vital Signs: BP 124/72   Pulse 95   Temp 98.2 F (36.8 C)   Resp 16   Ht 5' 4 (1.626 m)   Wt 200 lb 9.6 oz (91 kg)   SpO2 96%   BMI 34.43 kg/m    Physical Exam Vitals reviewed.  Constitutional:      General: Valerie Thornton is not in acute distress.    Appearance: Normal appearance. Valerie Thornton is obese. Valerie Thornton is not ill-appearing.  HENT:     Head: Normocephalic and atraumatic.  Eyes:     Pupils: Pupils are equal, round, and reactive to light.  Cardiovascular:     Rate and Rhythm: Normal rate and regular rhythm.  Pulmonary:     Effort: Pulmonary effort is normal. No respiratory distress.  Neurological:     Mental Status: Valerie Thornton is alert and oriented to person, place, and time.  Psychiatric:        Mood and Affect: Mood normal.        Behavior: Behavior normal.        Assessment/Plan: 1. Essential hypertension Stable, continue amlodipine  and benazepril  as prescribed. Routine labs ordered.  - amLODipine  (NORVASC ) 5 MG tablet; TAKE 1 TABLET(5 MG) BY MOUTH DAILY  Dispense: 90 tablet; Refill: 1 - CBC with Differential/Platelet - CMP14+EGFR - Lipid Profile - B12 and Folate Panel - Vitamin D  (25 hydroxy) - TSH + free T4 - Hgb A1C w/o eAG  2. History of TIAs Continue plavix  as prescribed. Routine labs ordered.  - clopidogrel  (PLAVIX ) 75 MG tablet; Take 1 tablet (75 mg total) by mouth daily.  Dispense: 90 tablet; Refill: 1 - CBC with Differential/Platelet - CMP14+EGFR - Lipid Profile - B12 and Folate Panel - Vitamin D  (25 hydroxy) - TSH + free T4 - Hgb A1C w/o eAG  3. Mixed hyperlipidemia Continue lovastatin  as prescribed. Routine labs ordered  - lovastatin  (MEVACOR ) 40 MG tablet; TAKE 1  TABLET(40 MG) BY MOUTH AT BEDTIME  Dispense: 90 tablet; Refill: 1 - CBC with Differential/Platelet - CMP14+EGFR - Lipid Profile - B12 and Folate Panel - Vitamin D  (25 hydroxy) - TSH + free T4 - Hgb A1C w/o eAG  4. Prediabetes (Primary) Routine labs ordered.  - CBC with Differential/Platelet - CMP14+EGFR - Lipid Profile - B12 and Folate Panel - Vitamin D  (25 hydroxy) - TSH + free T4 - Hgb A1C w/o eAG  5. Erythrocytosis Routine labs ordered  - CBC with Differential/Platelet - CMP14+EGFR - Lipid Profile - B12 and Folate Panel - Vitamin D  (25 hydroxy) - TSH + free T4 - Hgb A1C w/o eAG  6. Thyromegaly Routine labs ordered.  -  CBC with Differential/Platelet - CMP14+EGFR - Lipid Profile - B12 and Folate Panel - Vitamin D  (25 hydroxy) - TSH + free T4 - Hgb A1C w/o eAG  7. Multinodular goiter Routine labs ordered  - CBC with Differential/Platelet - CMP14+EGFR - Lipid Profile - B12 and Folate Panel - Vitamin D  (25 hydroxy) - TSH + free T4 - Hgb A1C w/o eAG  8. Vitamin D  deficiency Routine labs ordered  - CBC with Differential/Platelet - CMP14+EGFR - Lipid Profile - B12 and Folate Panel - Vitamin D  (25 hydroxy) - TSH + free T4 - Hgb A1C w/o eAG  9. B12 deficiency Routine labs ordered  - CBC with Differential/Platelet - CMP14+EGFR - Lipid Profile - B12 and Folate Panel - Vitamin D  (25 hydroxy) - TSH + free T4 - Hgb A1C w/o eAG   General Counseling: Valerie Thornton verbalizes understanding of the findings of todays visit and agrees with plan of treatment. I have discussed any further diagnostic evaluation that Nissen be needed or ordered today. We also reviewed her medications today. Valerie Thornton has been encouraged to call the office with any questions or concerns that should arise related to todays visit.    Orders Placed This Encounter  Procedures   CBC with Differential/Platelet   CMP14+EGFR   Lipid Profile   B12 and Folate Panel   Vitamin D  (25 hydroxy)   TSH + free  T4   Hgb A1C w/o eAG    Meds ordered this encounter  Medications   amLODipine  (NORVASC ) 5 MG tablet    Sig: TAKE 1 TABLET(5 MG) BY MOUTH DAILY    Dispense:  90 tablet    Refill:  1    For future refills   clopidogrel  (PLAVIX ) 75 MG tablet    Sig: Take 1 tablet (75 mg total) by mouth daily.    Dispense:  90 tablet    Refill:  1    For future refills, keep on file.   lovastatin  (MEVACOR ) 40 MG tablet    Sig: TAKE 1 TABLET(40 MG) BY MOUTH AT BEDTIME    Dispense:  90 tablet    Refill:  1    For future refills    Return for previously scheduled, AWV, Kandy Towery PCP in 6 months .   Total time spent:30 Minutes Time spent includes review of chart, medications, test results, and follow up plan with the patient.   [place] Controlled Substance Database was reviewed by me.  This patient was seen by Mardy Maxin, FNP-C in collaboration with Dr. Sigrid Bathe as a part of collaborative care agreement.   Laquida Cotrell R. Maxin, MSN, FNP-C Internal medicine

## 2024-03-16 ENCOUNTER — Encounter: Payer: Self-pay | Admitting: Nurse Practitioner

## 2024-06-12 ENCOUNTER — Ambulatory Visit

## 2024-06-12 DIAGNOSIS — L820 Inflamed seborrheic keratosis: Secondary | ICD-10-CM

## 2024-06-12 DIAGNOSIS — L578 Other skin changes due to chronic exposure to nonionizing radiation: Secondary | ICD-10-CM

## 2024-06-12 DIAGNOSIS — W908XXA Exposure to other nonionizing radiation, initial encounter: Secondary | ICD-10-CM

## 2024-06-12 DIAGNOSIS — L814 Other melanin hyperpigmentation: Secondary | ICD-10-CM

## 2024-06-12 DIAGNOSIS — Z1283 Encounter for screening for malignant neoplasm of skin: Secondary | ICD-10-CM | POA: Diagnosis not present

## 2024-06-12 DIAGNOSIS — L821 Other seborrheic keratosis: Secondary | ICD-10-CM

## 2024-06-12 DIAGNOSIS — D229 Melanocytic nevi, unspecified: Secondary | ICD-10-CM

## 2024-06-12 DIAGNOSIS — L570 Actinic keratosis: Secondary | ICD-10-CM | POA: Diagnosis not present

## 2024-06-12 DIAGNOSIS — D1801 Hemangioma of skin and subcutaneous tissue: Secondary | ICD-10-CM

## 2024-06-12 NOTE — Patient Instructions (Addendum)
 Cryotherapy Aftercare  Wash gently with soap and water everyday.   Apply Vaseline and Band-Aid daily until healed.   Sunscreen  Who needs sunscreen? Everyone. Sunscreen use can help prevent skin cancer by protecting you from the sun's harmful ultraviolet rays. Anyone can get skin cancer, regardless of age, gender or race. In fact, it is estimated that one in five Americans will develop skin cancer in their lifetime.  Sunscreen alone cannot fully protect you. In addition to wearing sunscreen, dermatologists recommend taking the following steps to protect your skin and find skin cancer early:  Seek shade when appropriate, remembering that the sun's rays are strongest between 10 a.m. and 2 p.m. If your shadow is shorter than you are, seek shade. Dress to protect yourself from the sun by wearing a lightweight long-sleeved shirt, pants, a wide-brimmed hat and sunglasses, when possible.  Use extra caution near water, snow and sand as they reflect the damaging rays of the sun, which can increase your chance of sunburn.  Get vitamin D  safely through a healthy diet that may include vitamin supplements. Don't seek the sun. Avoid tanning beds. Ultraviolet light from the sun and tanning beds can cause skin cancer and wrinkling. If you want to look tan, you may wish to use a self-tanning product, but continue to use sunscreen with it.  When should I use sunscreen? Every day you go outside--even if you're just walking to and from your form of transportation. The sun emits harmful UV rays year-round. Even on cloudy days, up to 80 percent of the sun's harmful UV rays can penetrate your skin. Snow, sand and water increase the need for sunscreen because they reflect the sun's rays.  How much sunscreen should I use, and how often should I apply it? Most people only apply 25-50 percent of the recommended amount of sunscreen. Apply enough sunscreen to cover all exposed skin. Most adults need about 1 ounce -- or enough  to fill a shot glass -- to fully cover their body.  Don't forget to apply to the tops of your feet, your neck, your ears and the top of your head. Apply sunscreen to dry skin 15 minutes before going outdoors.  Skin cancer also can form on the lips. To protect your lips, apply a lip balm or lipstick that contains sunscreen with an SPF of 30 or higher.  When outdoors, reapply sunscreen approximately every two hours, or after swimming or sweating, according to the directions on the bottle.   Broad-spectrum sunscreens protect against both UVA and UVB rays. What is the difference between the rays? Sunlight consists of two types of harmful rays that reach the earth -- UVA rays and UVB rays. Overexposure to either can lead to skin cancer. In addition to causing skin cancer, here's what each of these rays do:  UVA rays (or aging rays) can prematurely age your skin, causing wrinkles and age spots, and can pass through window glass. UVB rays (or burning rays) are the primary cause of sunburn and are blocked by window glass  There is no safe way to tan. Every time you tan, you damage your skin. As this damage builds, you speed up the aging of your skin and increase your risk for all types of skin cancer.  What is the difference between chemical and physical sunscreens? Chemical sunscreens work like a sponge, absorbing the sun's rays. They contain one or more of the following active ingredients: oxybenzone, avobenzone, octisalate, octocrylene, homosalate and octinoxate. These formulations tend  to be easier to rub into the skin without leaving a white residue.   Physical sunscreens work like a shield, sitting sit on the surface of your skin and deflecting the sun's rays. They contain the active ingredients zinc oxide and/or titanium dioxide. Use this sunscreen if you have sensitive skin.   What type of sunscreen should I use? The best type of sunscreen is the one you will use again and again. Just make sure it  offers broad-spectrum (UVA and UVB) protection, has an SPF of 30+, and is water-resistant. The kind of sunscreen you use is a matter of personal choice, and may vary depending on the area of the body to be protected. Available sunscreen options include lotions, creams, gels, ointments, wax sticks and sprays.  Recommended physical sunscreens for face: - Neutrogena Sheer Zinc - Aveeno Positively Mineral Sensitive - CeraVe Hydrating Mineral (also has a tinted version) - La Roche-Posay Anthelios Mineral Face (comes as a cream, lotion, light fluid, and there is also a tinted version).  - EltaMD UV Clear (also has a tinted version)  Recommended physical sunscreens for body: - Neutrogena Sheer Zinc Dry-Touch Sunscreen Sensitive Skin Lotion Broad Spectrum SPF 50 - Aveeno Positively Mineral Sensitive Skin Sunscreen Broad Spectrum SPF 50 - La Roche-Posay Anthelios SPF 50 Mineral Sunscreen - Gentle Lotion - CeraVe Hydrating Mineral Sunscreen SPF 50  Recommended chemical sunscreens for face: - Anthelios UV Correct Face Sunscreen SPF 70 with Niacinamide - Neutrogena Clear Face Oil-Free SPF 50 with Helioplex - Neutrogena Sport Face Oil-Free SPF 70+ with Helioplex - Aveeno Protect + Hydrate Sunscreen For Face SPF 70 - La Roche-Posay Anthelios Light Fluid Sunscreen for Face SPF 60  Recommended chemical sunscreens for body: - Neutrogena Ultra Sheer Dry-Touch Sunscreen SPF 70 - Aveeno Protect + Hydrate Broad Spectrum All-Day Hydration SPF 60 (comes in a big pump) - La Roche-Posay Anthelios Melt-In Milk Sunscreen SPF 60   Melanoma ABCDEs  Melanoma is the most dangerous type of skin cancer, and is the leading cause of death from skin disease.  You are more likely to develop melanoma if you: Have light-colored skin, light-colored eyes, or red or blond hair Spend a lot of time in the sun Tan regularly, either outdoors or in a tanning bed Have had blistering sunburns, especially during childhood Have a  close family member who has had a melanoma Have atypical moles or large birthmarks  Early detection of melanoma is key since treatment is typically straightforward and cure rates are extremely high if we catch it early.   The first sign of melanoma is often a change in a mole or a new dark spot.  The ABCDE system is a way of remembering the signs of melanoma.  A for asymmetry:  The two halves do not match. B for border:  The edges of the growth are irregular. C for color:  A mixture of colors are present instead of an even brown color. D for diameter:  Melanomas are usually (but not always) greater than 6mm - the size of a pencil eraser. E for evolution:  The spot keeps changing in size, shape, and color.  Please check your skin once per month between visits. You can use a small mirror in front and a large mirror behind you to keep an eye on the back side or your body.   If you see any new or changing lesions before your next follow-up, please call to schedule a visit.  Please continue daily skin protection including  broad spectrum sunscreen SPF 30+ to sun-exposed areas, reapplying every 2 hours as needed when you're outdoors.     Due to recent changes in healthcare laws, you may see results of your pathology and/or laboratory studies on MyChart before the doctors have had a chance to review them. We understand that in some cases there may be results that are confusing or concerning to you. Please understand that not all results are received at the same time and often the doctors may need to interpret multiple results in order to provide you with the best plan of care or course of treatment. Therefore, we ask that you please give us  2 business days to thoroughly review all your results before contacting the office for clarification. Should we see a critical lab result, you will be contacted sooner.   If You Need Anything After Your Visit  If you have any questions or concerns for your  doctor, please call our main line at [phone] and press option 4 to reach your doctor's medical assistant. If no one answers, please leave a voicemail as directed and we will return your call as soon as possible. Messages left after 4 pm will be answered the following business day.   You may also send us  a message via MyChart. We typically respond to MyChart messages within 1-2 business days.  For prescription refills, please ask your pharmacy to contact our office. Our fax number is [phone].  If you have an urgent issue when the clinic is closed that cannot wait until the next business day, you can page your doctor at the number below.    Please note that while we do our best to be available for urgent issues outside of office hours, we are not available 24/7.   If you have an urgent issue and are unable to reach us , you may choose to seek medical care at your doctor's office, retail clinic, urgent care center, or emergency room.  If you have a medical emergency, please immediately call 911 or go to the emergency department.  Pager Numbers  - Dr. Hester: [phone]  - Dr. Jackquline: [phone]  - Dr. Claudene: [phone]   - Dr. Raymund: [phone]  In the event of inclement weather, please call our main line at [phone] for an update on the status of any delays or closures.  Dermatology Medication Tips: Please keep the boxes that topical medications come in in order to help keep track of the instructions about where and how to use these. Pharmacies typically print the medication instructions only on the boxes and not directly on the medication tubes.   If your medication is too expensive, please contact our office at [phone] option 4 or send us  a message through MyChart.   We are unable to tell what your co-pay for medications will be in advance as this is different depending on your insurance coverage. However, we may be able to find a substitute medication at  lower cost or fill out paperwork to get insurance to cover a needed medication.   If a prior authorization is required to get your medication covered by your insurance company, please allow us  1-2 business days to complete this process.  Drug prices often vary depending on where the prescription is filled and some pharmacies may offer cheaper prices.  The website www.goodrx.com contains coupons for medications through different pharmacies. The prices here do not account for what the cost may be with help from insurance (it may be cheaper with your  insurance), but the website can give you the price if you did not use any insurance.  - You can print the associated coupon and take it with your prescription to the pharmacy.  - You may also stop by our office during regular business hours and pick up a GoodRx coupon card.  - If you need your prescription sent electronically to a different pharmacy, notify our office through [facility] or by phone at [phone] option 4.     Si Usted Necesita Algo Despus de Su Visita  Tambin puede enviarnos un mensaje a travs de Clinical cytogeneticist. Por lo general respondemos a los mensajes de MyChart en el transcurso de 1 a 2 das hbiles.  Para renovar recetas, por favor pida a su farmacia que se ponga en contacto con nuestra oficina. Randi lakes de fax es [place] [phone].  Si tiene un asunto urgente cuando la clnica est cerrada y que no puede esperar hasta el siguiente da hbil, puede llamar/localizar a su doctor(a) al nmero que aparece a continuacin.   Por favor, tenga en cuenta que aunque hacemos todo lo posible para estar disponibles para asuntos urgentes fuera del horario de [place], no estamos disponibles las 24 horas del da, los 7 [address]  [address] de la [place].   Si tiene un problema urgente y no puede comunicarse con nosotros, puede optar por buscar atencin mdica  en el consultorio de su doctor(a), en una clnica privada, en un centro de atencin urgente  o en una sala de emergencias.  Si tiene Engineer, drilling, por favor llame inmediatamente al 911 o vaya a la sala de emergencias.  Nmeros de bper  - Dr. Hester: [phone]  - Dra. Jackquline: [phone]  - Dr. Claudene: [phone]  - Dra. Kitts: [phone]  En caso de inclemencias del [place], por favor llame a nuestra lnea principal al [phone] para una actualizacin sobre el estado de cualquier retraso o cierre.  Consejos para la medicacin en dermatologa: Por favor, guarde las cajas en las que vienen los medicamentos de uso tpico para ayudarle a seguir las instrucciones sobre dnde y cmo usarlos. Las farmacias generalmente imprimen las instrucciones del medicamento slo en las cajas y no directamente en los tubos del [place].   Si su medicamento es muy caro, por favor, pngase en contacto con landry rieger llamando al [phone] y presione la opcin 4 o envenos un mensaje a travs de Clinical cytogeneticist.   No podemos decirle cul ser su copago por los medicamentos por adelantado ya que esto es diferente dependiendo de la cobertura de su seguro. Sin embargo, es posible que podamos encontrar un medicamento sustituto a Audiological scientist un formulario para que el seguro cubra el medicamento que se considera necesario.   Si se requiere una autorizacin previa para que su compaa de seguros malta su medicamento, por favor permtanos de 1 a 2 das hbiles para completar este proceso.  Los precios de los medicamentos varan con frecuencia dependiendo del Environmental consultant de dnde se surte la receta y alguna farmacias pueden ofrecer precios ms baratos.  El sitio web www.goodrx.com tiene cupones para medicamentos de Health and safety inspector. Los precios aqu no tienen en cuenta lo que podra costar con la ayuda del seguro (puede ser ms barato con su seguro), pero el sitio web puede darle el precio si no utiliz Tourist information centre manager.  - Puede imprimir el cupn correspondiente y llevarlo con su  receta a la farmacia.  - Tambin puede pasar por nuestra oficina durante el horario de atencin regular  y recoger una tarjeta de cupones de GoodRx.  - Si necesita que su receta se enve electrnicamente a una farmacia diferente, informe a nuestra oficina a travs de MyChart de [place] o por telfono llamando al [phone] y presione la opcin 4.

## 2024-06-12 NOTE — Progress Notes (Signed)
 Subjective   Valerie Thornton is a 68 y.o. female who presents for the following: Total body skin exam for skin cancer screening and mole check. The patient has spots, moles and lesions to be evaluated, some [place] be new or changing and the patient [place] have concern these could be cancer.. Patient is new patient  Today patient reports: Areas of concern on her scalp, neck and arms.   Review of Systems:    No other skin or systemic complaints except as noted in HPI or Assessment and Plan.  The following portions of the chart were reviewed this encounter and updated as appropriate: medications, allergies, medical history  Relevant Medical History:  n/a   Objective  Well appearing patient in no apparent distress; mood and affect are within normal limits. Examination was performed of the: Full Skin Examination: scalp, head, eyes, ears, nose, lips, neck, chest, axillae, abdomen, back, buttocks, bilateral upper extremities, bilateral lower extremities, hands, feet, fingers, toes, fingernails, and toenails.   Examination notable for: SKIN EXAM, Angioma(s): Scattered red vascular papule(s)  , Lentigo/lentigines: Scattered pigmented macules that are tan to brown in color and are somewhat non-uniform in shape and concentrated in the sun-exposed areas, Nevus/nevi: Scattered well-demarcated, regular, pigmented macule(s) and/or papule(s)  , Seborrheic Keratosis(es): Stuck-on appearing keratotic papule(s) on the trunk, some  irritated with redness, crusting, edema, and/or partial avulsion, Actinic Damage/Elastosis: chronic sun damage: dyspigmentation, telangiectasia, and wrinkling, Actinic keratosis: Scaly erythematous macule(s) concentrated on sun exposed areas   Examination limited by: Shoes and Undergarments; patient deferred removal     Scalp (3) Pink scaly macules Neck (2) Stuck on waxy paps with erythema  Assessment & Plan   SKIN CANCER SCREENING PERFORMED TODAY.  BENIGN SKIN FINDINGS  -  Lentigines  - Seborrheic keratoses  - Hemangiomas   - Nevus/Multiple Benign Nevi - Reassurance provided regarding the benign appearance of lesions noted on exam today; no treatment is indicated in the absence of symptoms/changes. - Reinforced importance of photoprotective strategies including liberal and frequent sunscreen use of a broad-spectrum SPF 30 or greater, use of protective clothing, and sun avoidance for prevention of cutaneous malignancy and photoaging.  Counseled patient on the importance of regular self-skin monitoring as well as routine clinical skin examinations as scheduled.   ACTINIC DAMAGE - Chronic condition, secondary to cumulative UV/sun exposure - Recommend daily broad spectrum sunscreen SPF 30+ to sun-exposed areas, reapply every 2 hours as needed.  - Staying in the shade or wearing long sleeves, sun glasses (UVA+UVB protection) and wide brim hats (4-inch brim around the entire circumference of the hat) are also recommended for sun protection.  - Call for new or changing lesions.  Level of service outlined above   Procedures, orders, diagnosis for this visit:  ACTINIC KERATOSIS (3) Scalp (3) Actinic keratoses are precancerous spots that appear secondary to cumulative UV radiation exposure/sun exposure over time. They are chronic with expected duration over 1 year. A portion of actinic keratoses will progress to squamous cell carcinoma of the skin. It is not possible to reliably predict which spots will progress to skin cancer and so treatment is recommended to prevent development of skin cancer.  Recommend daily broad spectrum sunscreen SPF 30+ to sun-exposed areas, reapply every 2 hours as needed.  Recommend staying in the shade or wearing long sleeves, sun glasses (UVA+UVB protection) and wide brim hats (4-inch brim around the entire circumference of the hat). Call for new or changing lesions. Destruction of lesion - Scalp (3)  Complexity: simple   Destruction method:  cryotherapy   Informed consent: discussed and consent obtained   Timeout:  patient name, date of birth, surgical site, and procedure verified Lesion destroyed using liquid nitrogen: Yes   Region frozen until ice ball extended beyond lesion: Yes   Cryo cycles: 1 or 2. Outcome: patient tolerated procedure well with no complications   Post-procedure details: wound care instructions given    INFLAMED SEBORRHEIC KERATOSIS (2) Neck (2) Symptomatic, irritating, patient would like treated. Destruction of lesion - Neck (2) Complexity: simple   Destruction method: cryotherapy   Informed consent: discussed and consent obtained   Timeout:  patient name, date of birth, surgical site, and procedure verified Lesion destroyed using liquid nitrogen: Yes   Region frozen until ice ball extended beyond lesion: Yes   Cryo cycles: 1 or 2. Outcome: patient tolerated procedure well with no complications   Post-procedure details: wound care instructions given     Actinic keratosis -     Destruction of lesion  Inflamed seborrheic keratosis -     Destruction of lesion    Return to clinic: Return in about 1 year (around 06/12/2025) for TBSE.  I, Emerick Ege, CMA am acting as scribe for Lauraine JAYSON Kanaris, MD.   Documentation: I have reviewed the above documentation for accuracy and completeness, and I agree with the above.  Lauraine JAYSON Kanaris, MD

## 2024-06-30 ENCOUNTER — Other Ambulatory Visit: Payer: Self-pay | Admitting: Nurse Practitioner

## 2024-06-30 DIAGNOSIS — I10 Essential (primary) hypertension: Secondary | ICD-10-CM

## 2024-07-04 ENCOUNTER — Other Ambulatory Visit: Payer: Self-pay

## 2024-07-04 DIAGNOSIS — D751 Secondary polycythemia: Secondary | ICD-10-CM

## 2024-07-05 ENCOUNTER — Inpatient Hospital Stay: Payer: 59 | Attending: Oncology

## 2024-07-05 ENCOUNTER — Inpatient Hospital Stay (HOSPITAL_BASED_OUTPATIENT_CLINIC_OR_DEPARTMENT_OTHER): Payer: 59 | Admitting: Oncology

## 2024-07-05 ENCOUNTER — Encounter: Payer: Self-pay | Admitting: Oncology

## 2024-07-05 VITALS — BP 116/69 | HR 70 | Temp 98.0°F | Resp 20 | Ht 64.0 in | Wt 200.7 lb

## 2024-07-05 DIAGNOSIS — Z803 Family history of malignant neoplasm of breast: Secondary | ICD-10-CM | POA: Insufficient documentation

## 2024-07-05 DIAGNOSIS — D751 Secondary polycythemia: Secondary | ICD-10-CM

## 2024-07-05 LAB — CBC WITH DIFFERENTIAL (CANCER CENTER ONLY)
Abs Immature Granulocytes: 0.02 K/uL (ref 0.00–0.07)
Basophils Absolute: 0 K/uL (ref 0.0–0.1)
Basophils Relative: 0 %
Eosinophils Absolute: 0.2 K/uL (ref 0.0–0.5)
Eosinophils Relative: 2 %
HCT: 47.6 % — ABNORMAL HIGH (ref 36.0–46.0)
Hemoglobin: 15.9 g/dL — ABNORMAL HIGH (ref 12.0–15.0)
Immature Granulocytes: 0 %
Lymphocytes Relative: 28 %
Lymphs Abs: 2.3 K/uL (ref 0.7–4.0)
MCH: 29.4 pg (ref 26.0–34.0)
MCHC: 33.4 g/dL (ref 30.0–36.0)
MCV: 88 fL (ref 80.0–100.0)
Monocytes Absolute: 0.8 K/uL (ref 0.1–1.0)
Monocytes Relative: 9 %
Neutro Abs: 4.9 K/uL (ref 1.7–7.7)
Neutrophils Relative %: 61 %
Platelet Count: 230 K/uL (ref 150–400)
RBC: 5.41 MIL/uL — ABNORMAL HIGH (ref 3.87–5.11)
RDW: 14.3 % (ref 11.5–15.5)
WBC Count: 8.1 K/uL (ref 4.0–10.5)
nRBC: 0 % (ref 0.0–0.2)

## 2024-07-05 NOTE — Progress Notes (Signed)
 Patient doing well; no new or acute concerns at this time.

## 2024-07-05 NOTE — Progress Notes (Signed)
 Hematology/Oncology Consult note [facility]  [phone] [phone]  Patient Care Team: Liana Fish, NP as PCP - General (Nurse Practitioner) Fernand Sigrid HERO, MD (Internal Medicine)   Name of the patient: Valerie Thornton  [id number]  26-Mar-1956   Date of visit: 07/05/24  Diagnosis- secondary polycythemia of unclear etiology  Chief complaint/ Reason for visit-follow-up of secondary polycythemia  Heme/Onc history: Patient is a 68 year old female with a past medical history significant for hypertension migraines and possible TIA who has been referred for elevated hemoglobin.  CBC from 08/25/2022 showed H&H of 16.8/51.2 with a white count of 6.2 and platelets of 253.  Looking back at her prior CBCs her hemoglobin has been between 16-17 dating back to 2017 with no clear rising trend.   Patient is a lifelong non-smoker.  No baseline history of any chronic lung diseases.  Denies any family history of polycythemia.   Results of blood work from February 2024 showed normal EPO levels.  No evidence of JAK2 mutation.  Example mutation testing negative.  Urinalysis negative for hematuria.      Interval history-patient is doing well presently and denies any acute health concerns.  No recent hospitalizations.  No cough.  No history of thrombosis. History of Present Illness  ECOG PS- 1 Pain scale- 0   Review of systems- Review of Systems  Constitutional:  Negative for chills, fever, malaise/fatigue and weight loss.  HENT:  Negative for congestion, ear discharge and nosebleeds.   Eyes:  Negative for blurred vision.  Respiratory:  Negative for cough, hemoptysis, sputum production, shortness of breath and wheezing.   Cardiovascular:  Negative for chest pain, palpitations, orthopnea and claudication.  Gastrointestinal:  Negative for abdominal pain, blood in stool, constipation, diarrhea, heartburn, melena, nausea and vomiting.  Genitourinary:  Negative  for dysuria, flank pain, frequency, hematuria and urgency.  Musculoskeletal:  Negative for back pain, joint pain and myalgias.  Skin:  Negative for rash.  Neurological:  Negative for dizziness, tingling, focal weakness, seizures, weakness and headaches.  Endo/Heme/Allergies:  Does not bruise/bleed easily.  Psychiatric/Behavioral:  Negative for depression and suicidal ideas. The patient does not have insomnia.       Allergies  Allergen Reactions   Sulfa Antibiotics Hives     Past Medical History:  Diagnosis Date   Erythrocytosis    Hypertension    Migraines    per pt it was hormonal and food caused, hardly anymore she has one   TIA (transient ischemic attack) 2015   affected right side but completely resolved     Past Surgical History:  Procedure Laterality Date   TONSILLECTOMY AND ADENOIDECTOMY     pt was 68 years old    Social History   Socioeconomic History   Marital status: Married    Spouse name: Not on file   Number of children: Not on file   Years of education: Not on file   Highest education level: Not on file  Occupational History   Not on file  Tobacco Use   Smoking status: Never   Smokeless tobacco: Never  Substance and Sexual Activity   Alcohol use: Yes    Comment: few times a month    Drug use: No   Sexual activity: Not Currently  Other Topics Concern   Not on file  Social History Narrative   Not on file   Social Drivers of Health   Financial Resource Strain: Not on file  Food Insecurity: No Food Insecurity (10/10/2022)  Hunger Vital Sign    Worried About Running Out of Food in the Last Year: Never true    Ran Out of Food in the Last Year: Never true  Transportation Needs: No Transportation Needs (10/10/2022)   PRAPARE - Administrator, Civil Service (Medical): No    Lack of Transportation (Non-Medical): No  Physical Activity: Not on file  Stress: Not on file  Social Connections: Not on file  Intimate Partner Violence: Not At  Risk (10/10/2022)   Humiliation, Afraid, Rape, and Kick questionnaire    Fear of Current or Ex-Partner: No    Emotionally Abused: No    Physically Abused: No    Sexually Abused: No    Family History  Problem Relation Age of Onset   Breast cancer Maternal Aunt 45   Hypertension Mother    Hypertension Sister      Current Outpatient Medications:    amLODipine  (NORVASC ) 5 MG tablet, TAKE 1 TABLET(5 MG) BY MOUTH DAILY, Disp: 90 tablet, Rfl: 1   benazepril  (LOTENSIN ) 5 MG tablet, TAKE 1 TABLET(5 MG) BY MOUTH DAILY, Disp: 90 tablet, Rfl: 1   clopidogrel  (PLAVIX ) 75 MG tablet, Take 1 tablet (75 mg total) by mouth daily., Disp: 90 tablet, Rfl: 1   lovastatin  (MEVACOR ) 40 MG tablet, TAKE 1 TABLET(40 MG) BY MOUTH AT BEDTIME, Disp: 90 tablet, Rfl: 1   clotrimazole -betamethasone  (LOTRISONE ) cream, Apply 1 application topically 2 (two) times daily., Disp: 45 g, Rfl: 2  Physical exam:  Vitals:   07/05/24 1132  BP: 116/69  Pulse: 70  Resp: 20  Temp: 98 F (36.7 C)  TempSrc: Tympanic  SpO2: (!) 68%  Weight: 200 lb 11.2 oz (91 kg)  Height: 5' 4 (1.626 m)   Physical Exam Cardiovascular:     Rate and Rhythm: Normal rate and regular rhythm.     Heart sounds: Normal heart sounds.  Pulmonary:     Effort: Pulmonary effort is normal.     Breath sounds: Normal breath sounds.  Skin:    General: Skin is warm and dry.  Neurological:     Mental Status: She is alert and oriented to person, place, and time.      I have personally reviewed labs listed below:    Latest Ref Rng & Units 09/25/2023    7:13 AM  CMP  Glucose 70 - 99 mg/dL 892   BUN 8 - 27 mg/dL 11   Creatinine 9.42 - 1.00 mg/dL 9.23   Sodium 865 - 855 mmol/L 143   Potassium 3.5 - 5.2 mmol/L 4.4   Chloride 96 - 106 mmol/L 102   CO2 20 - 29 mmol/L 23   Calcium 8.7 - 10.3 mg/dL 9.4   Total Protein 6.0 - 8.5 g/dL 6.6   Total Bilirubin 0.0 - 1.2 mg/dL 0.5   Alkaline Phos 44 - 121 IU/L 111   AST 0 - 40 IU/L 22   ALT 0 - 32 IU/L  21       Latest Ref Rng & Units 07/05/2024   11:14 AM  CBC  WBC 4.0 - 10.5 K/uL 8.1   Hemoglobin 12.0 - 15.0 g/dL 84.0   Hematocrit 63.9 - 46.0 % 47.6   Platelets 150 - 400 K/uL 230    I have personally reviewed Radiology images listed below: No images are attached to the encounter.  No results found.   Assessment and plan- Patient is a 68 y.o. female here for routine follow-up of secondary polycythemia  Workup for  primary polycythemia vera including JAK2 mutation has been negative.  Patient has secondary polycythemia of unclear etiology with a hemoglobin that has remained stable between 15-16 over the years without a clear rising trend.  She is a non-smoker.  There [place] be a component of sleep apnea.  No indication for phlebotomy unless hematocrit greater than 55.  Yearly basis   Visit Diagnosis 1. Polycythemia, secondary   2. Polycythemia      Dr. Annah Skene, MD, MPH [facility] [phone] 07/05/2024 1:16 PM

## 2024-08-20 ENCOUNTER — Other Ambulatory Visit: Payer: Self-pay | Admitting: Nurse Practitioner

## 2024-08-20 DIAGNOSIS — Z1231 Encounter for screening mammogram for malignant neoplasm of breast: Secondary | ICD-10-CM

## 2024-09-04 ENCOUNTER — Other Ambulatory Visit: Payer: Self-pay | Admitting: Nurse Practitioner

## 2024-09-04 DIAGNOSIS — Z1231 Encounter for screening mammogram for malignant neoplasm of breast: Secondary | ICD-10-CM

## 2024-09-11 ENCOUNTER — Encounter: Payer: Self-pay | Admitting: Nurse Practitioner

## 2024-09-11 ENCOUNTER — Ambulatory Visit (INDEPENDENT_AMBULATORY_CARE_PROVIDER_SITE_OTHER): Payer: 59 | Admitting: Nurse Practitioner

## 2024-09-11 VITALS — BP 130/80 | HR 82 | Temp 96.5°F | Resp 16 | Ht 64.0 in | Wt 201.0 lb

## 2024-09-11 DIAGNOSIS — E538 Deficiency of other specified B group vitamins: Secondary | ICD-10-CM

## 2024-09-11 DIAGNOSIS — R7303 Prediabetes: Secondary | ICD-10-CM

## 2024-09-11 DIAGNOSIS — E559 Vitamin D deficiency, unspecified: Secondary | ICD-10-CM | POA: Diagnosis not present

## 2024-09-11 DIAGNOSIS — Z1231 Encounter for screening mammogram for malignant neoplasm of breast: Secondary | ICD-10-CM

## 2024-09-11 DIAGNOSIS — Z8673 Personal history of transient ischemic attack (TIA), and cerebral infarction without residual deficits: Secondary | ICD-10-CM

## 2024-09-11 DIAGNOSIS — Z0001 Encounter for general adult medical examination with abnormal findings: Secondary | ICD-10-CM | POA: Diagnosis not present

## 2024-09-11 DIAGNOSIS — I10 Essential (primary) hypertension: Secondary | ICD-10-CM

## 2024-09-11 DIAGNOSIS — E782 Mixed hyperlipidemia: Secondary | ICD-10-CM | POA: Diagnosis not present

## 2024-09-11 DIAGNOSIS — D751 Secondary polycythemia: Secondary | ICD-10-CM

## 2024-09-11 DIAGNOSIS — E010 Iodine-deficiency related diffuse (endemic) goiter: Secondary | ICD-10-CM

## 2024-09-11 MED ORDER — CLOPIDOGREL BISULFATE 75 MG PO TABS: 75.0000 mg | ORAL_TABLET | Freq: Every day | ORAL | 1 refills | Status: AC

## 2024-09-11 MED ORDER — AMLODIPINE BESYLATE 5 MG PO TABS: ORAL_TABLET | ORAL | 1 refills | Status: AC

## 2024-09-11 MED ORDER — LOVASTATIN 40 MG PO TABS: ORAL_TABLET | ORAL | 1 refills | Status: AC

## 2024-09-11 NOTE — Progress Notes (Signed)
 [facility] [address] [place], [place] [address]  Internal MEDICINE  Office Visit Note  Patient Name: Valerie Thornton  [id number]  [id number]  Date of Service: 09/11/2024  Chief Complaint  Patient presents with   Hypertension   Annual Exam    HPI Meeka presents for an annual well visit and physical exam.  Well-appearing 69 y.o. female with hypertension, high cholesterol, thyromegaly, prediabetes, low vitamin D , osteoarthritis and history of TIA  Routine CRC screening: cologuard due in January 2027 Routine mammogram: due now, insurance will not cover [facility], needs to change locations.  DEXA scan:done in 2022 Pap smear: discontinued, aged out.  Labs: due for routine labs  New or worsening pain: none Other concerns: none    Current Medication: Outpatient Encounter Medications as of 09/11/2024  Medication Sig   amLODipine  (NORVASC ) 5 MG tablet TAKE 1 TABLET(5 MG) BY MOUTH DAILY   benazepril  (LOTENSIN ) 5 MG tablet TAKE 1 TABLET(5 MG) BY MOUTH DAILY   clopidogrel  (PLAVIX ) 75 MG tablet Take 1 tablet (75 mg total) by mouth daily.   clotrimazole -betamethasone  (LOTRISONE ) cream Apply 1 application topically 2 (two) times daily.   lovastatin  (MEVACOR ) 40 MG tablet TAKE 1 TABLET(40 MG) BY MOUTH AT BEDTIME   [DISCONTINUED] amLODipine  (NORVASC ) 5 MG tablet TAKE 1 TABLET(5 MG) BY MOUTH DAILY   [DISCONTINUED] clopidogrel  (PLAVIX ) 75 MG tablet Take 1 tablet (75 mg total) by mouth daily.   [DISCONTINUED] lovastatin  (MEVACOR ) 40 MG tablet TAKE 1 TABLET(40 MG) BY MOUTH AT BEDTIME   No facility-administered encounter medications on file as of 09/11/2024.    Surgical History: Past Surgical History:  Procedure Laterality Date   TONSILLECTOMY AND ADENOIDECTOMY     pt was 69 years old    Medical History: Past Medical History:  Diagnosis Date   Erythrocytosis    Hypertension    Migraines    per pt it was hormonal and food caused, hardly anymore she has one   TIA  (transient ischemic attack) 2015   affected right side but completely resolved    Family History: Family History  Problem Relation Age of Onset   Breast cancer Maternal Aunt 60   Hypertension Mother    Hypertension Sister     Social History   Socioeconomic History   Marital status: Married    Spouse name: Not on file   Number of children: Not on file   Years of education: Not on file   Highest education level: Not on file  Occupational History   Not on file  Tobacco Use   Smoking status: Never   Smokeless tobacco: Never  Substance and Sexual Activity   Alcohol use: Yes    Comment: few times a month    Drug use: No   Sexual activity: Not Currently  Other Topics Concern   Not on file  Social History Narrative   Not on file   Social Drivers of Health   Tobacco Use: Low Risk (09/11/2024)   Patient History    Smoking Tobacco Use: Never    Smokeless Tobacco Use: Never    Passive Exposure: Not on file  Financial Resource Strain: Not on file  Food Insecurity: No Food Insecurity (10/10/2022)   Hunger Vital Sign    Worried About Running Out of Food in the Last Year: Never true    Ran Out of Food in the Last Year: Never true  Transportation Needs: No Transportation Needs (10/10/2022)   PRAPARE - Transportation    Lack  of Transportation (Medical): No    Lack of Transportation (Non-Medical): No  Physical Activity: Not on file  Stress: Not on file  Social Connections: Not on file  Intimate Partner Violence: Not At Risk (10/10/2022)   Humiliation, Afraid, Rape, and Kick questionnaire    Fear of Current or Ex-Partner: No    Emotionally Abused: No    Physically Abused: No    Sexually Abused: No  Depression (PHQ2-9): Low Risk (09/11/2024)   Depression (PHQ2-9)    PHQ-2 Score: 0  Alcohol Screen: Low Risk (01/26/2022)   Alcohol Screen    Last Alcohol Screening Score (AUDIT): 2  Housing: Low Risk (10/10/2022)   Housing    Last Housing Risk Score: 0  Utilities: Not At Risk  (10/10/2022)   AHC Utilities    Threatened with loss of utilities: No  Health Literacy: Not on file      Review of Systems  Constitutional:  Negative for activity change, appetite change, chills, fatigue, fever and unexpected weight change.  HENT: Negative.  Negative for congestion, ear pain, rhinorrhea, sore throat and trouble swallowing.   Eyes: Negative.   Respiratory: Negative.  Negative for cough, chest tightness, shortness of breath and wheezing.   Cardiovascular: Negative.  Negative for chest pain.  Gastrointestinal: Negative.  Negative for abdominal pain, blood in stool, constipation, diarrhea, nausea and vomiting.  Endocrine: Negative.   Genitourinary: Negative.  Negative for difficulty urinating, dysuria, frequency, hematuria and urgency.  Musculoskeletal: Negative.  Negative for arthralgias, back pain, joint swelling, myalgias and neck pain.  Skin: Negative.  Negative for rash and wound.  Allergic/Immunologic: Negative.  Negative for immunocompromised state.  Neurological: Negative.  Negative for dizziness, seizures, numbness and headaches.  Hematological: Negative.   Psychiatric/Behavioral: Negative.  Negative for behavioral problems, self-injury and suicidal ideas. The patient is not nervous/anxious.     Vital Signs: BP 130/80   Pulse 82   Temp (!) 96.5 F (35.8 C)   Resp 16   Ht 5' 4 (1.626 m)   Wt 201 lb (91.2 kg)   SpO2 97%   BMI 34.50 kg/m    Physical Exam Vitals reviewed.  Constitutional:      General: She is awake. She is not in acute distress.    Appearance: Normal appearance. She is well-developed and well-groomed. She is obese. She is not ill-appearing or diaphoretic.  HENT:     Head: Normocephalic and atraumatic.     Right Ear: Tympanic membrane, ear canal and external ear normal.     Left Ear: Tympanic membrane, ear canal and external ear normal.     Nose: Nose normal. No congestion or rhinorrhea.     Mouth/Throat:     Lips: Pink.     Mouth:  Mucous membranes are moist.     Pharynx: Oropharynx is clear. Uvula midline. No oropharyngeal exudate or posterior oropharyngeal erythema.  Eyes:     General: Lids are normal. Vision grossly intact. Gaze aligned appropriately. No scleral icterus.       Right eye: No discharge.        Left eye: No discharge.     Extraocular Movements: Extraocular movements intact.     Conjunctiva/sclera: Conjunctivae normal.     Pupils: Pupils are equal, round, and reactive to light.     Funduscopic exam:    Right eye: Red reflex present.        Left eye: Red reflex present. Neck:     Thyroid : No thyromegaly.     Vascular: No  JVD.     Trachea: Trachea and phonation normal. No tracheal deviation.  Cardiovascular:     Rate and Rhythm: Normal rate and regular rhythm.     Heart sounds: Normal heart sounds, S1 normal and S2 normal. No murmur heard.    No friction rub. No gallop.  Pulmonary:     Effort: Pulmonary effort is normal. No accessory muscle usage or respiratory distress.     Breath sounds: Normal breath sounds and air entry. No stridor. No wheezing or rales.  Chest:     Chest wall: No tenderness.     Comments: Declined clinical breast exam, gets annual mammograms, most recent one was normal. Abdominal:     General: Bowel sounds are normal. There is no distension.     Palpations: Abdomen is soft. There is no shifting dullness, fluid wave, mass or pulsatile mass.     Tenderness: There is no abdominal tenderness. There is no guarding or rebound.  Musculoskeletal:        General: No tenderness or deformity. Normal range of motion.     Cervical back: Normal range of motion and neck supple.     Right lower leg: No edema.     Left lower leg: No edema.  Lymphadenopathy:     Cervical: No cervical adenopathy.  Skin:    General: Skin is warm and dry.     Coloration: Skin is not pale.     Findings: No erythema or rash.  Neurological:     Mental Status: She is alert and oriented to person, place, and  time.     Cranial Nerves: No cranial nerve deficit.     Motor: No abnormal muscle tone.     Coordination: Coordination normal.     Gait: Gait normal.     Deep Tendon Reflexes: Reflexes are normal and symmetric.  Psychiatric:        Mood and Affect: Mood normal.        Behavior: Behavior normal. Behavior is cooperative.        Thought Content: Thought content normal.        Judgment: Judgment normal.        Assessment/Plan: 1. Encounter for routine adult health examination with abnormal findings (Primary) Age-appropriate preventive screenings and vaccinations discussed, annual physical exam completed. Routine labs for health maintenance ordered, see below. PHM updated.   - CBC with Differential/Platelet - CMP14+EGFR - Lipid Profile - B12 and Folate Panel - Vitamin D  (25 hydroxy) - TSH + free T4 - Hgb A1C w/o eAG  2. Essential hypertension Stable, Routine labs ordered. Continue amlodipine  as prescribed  - CBC with Differential/Platelet - CMP14+EGFR - Lipid Profile - amLODipine  (NORVASC ) 5 MG tablet; TAKE 1 TABLET(5 MG) BY MOUTH DAILY  Dispense: 90 tablet; Refill: 1  3. Mixed hyperlipidemia Routine labs ordered . Continue lovastatin  as prescribed  - CBC with Differential/Platelet - CMP14+EGFR - Lipid Profile - lovastatin  (MEVACOR ) 40 MG tablet; TAKE 1 TABLET(40 MG) BY MOUTH AT BEDTIME  Dispense: 90 tablet; Refill: 1  4. Prediabetes Routine labs ordered  - CBC with Differential/Platelet - CMP14+EGFR - Hgb A1C w/o eAG  5. Thyromegaly Routine labs ordered  - TSH + free T4  6. Erythrocytosis Routine labs ordered  - CBC with Differential/Platelet - B12 and Folate Panel  7. B12 deficiency Routine labs ordered  - CBC with Differential/Platelet - B12 and Folate Panel  8. Vitamin D  deficiency Routine lab ordered  - Vitamin D  (25 hydroxy)  9. Encounter for screening  mammogram for malignant neoplasm of breast Routine screening mammogram ordered  - MM 3D SCREENING  MAMMOGRAM BILATERAL BREAST; Future  10. History of TIAs Continue plavix  as prescribed. Routine lab ordered  - Lipid Profile - clopidogrel  (PLAVIX ) 75 MG tablet; Take 1 tablet (75 mg total) by mouth daily.  Dispense: 90 tablet; Refill: 1    General Counseling: gwendolyn mclees understanding of the findings of todays visit and agrees with plan of treatment. I have discussed any further diagnostic evaluation that [place] be needed or ordered today. We also reviewed her medications today. she has been encouraged to call the office with any questions or concerns that should arise related to todays visit.    Orders Placed This Encounter  Procedures   MM 3D SCREENING MAMMOGRAM BILATERAL BREAST   CBC with Differential/Platelet   CMP14+EGFR   Lipid Profile   B12 and Folate Panel   Vitamin D  (25 hydroxy)   TSH + free T4   Hgb A1C w/o eAG    Meds ordered this encounter  Medications   amLODipine  (NORVASC ) 5 MG tablet    Sig: TAKE 1 TABLET(5 MG) BY MOUTH DAILY    Dispense:  90 tablet    Refill:  1    For future refills   clopidogrel  (PLAVIX ) 75 MG tablet    Sig: Take 1 tablet (75 mg total) by mouth daily.    Dispense:  90 tablet    Refill:  1    For future refills, keep on file.   lovastatin  (MEVACOR ) 40 MG tablet    Sig: TAKE 1 TABLET(40 MG) BY MOUTH AT BEDTIME    Dispense:  90 tablet    Refill:  1    For future refills    Return in about 6 months (around 03/11/2025) for F/U, Tamra Koos PCP, prefers not to follow up for labs, would like call if abnormal. .   Total time spent:30 Minutes Time spent includes review of chart, medications, test results, and follow up plan with the patient.   [place] Controlled Substance Database was reviewed by me.  This patient was seen by Mardy Maxin, FNP-C in collaboration with Dr. Sigrid Bathe as a part of collaborative care agreement.  Versie Fleener R. Maxin, MSN, FNP-C Internal medicine

## 2024-09-12 ENCOUNTER — Encounter

## 2024-09-18 ENCOUNTER — Ambulatory Visit

## 2025-01-02 ENCOUNTER — Inpatient Hospital Stay

## 2025-03-12 ENCOUNTER — Ambulatory Visit: Admitting: Nurse Practitioner

## 2025-06-12 ENCOUNTER — Ambulatory Visit

## 2025-07-04 ENCOUNTER — Inpatient Hospital Stay

## 2025-07-04 ENCOUNTER — Inpatient Hospital Stay: Admitting: Oncology

## 2025-09-15 ENCOUNTER — Encounter: Admitting: Nurse Practitioner
